# Patient Record
Sex: Female | Born: 1955 | Race: White | Hispanic: No | Marital: Single | Smoking: Former smoker
Health system: Southern US, Community
[De-identification: ages and names within clinical notes are randomized; demographics above are authoritative.]

## PROBLEM LIST (undated history)

## (undated) DIAGNOSIS — M1990 Unspecified osteoarthritis, unspecified site: Secondary | ICD-10-CM

## (undated) DIAGNOSIS — G8929 Other chronic pain: Secondary | ICD-10-CM

## (undated) DIAGNOSIS — J45909 Unspecified asthma, uncomplicated: Secondary | ICD-10-CM

## (undated) DIAGNOSIS — L0390 Cellulitis, unspecified: Secondary | ICD-10-CM

## (undated) DIAGNOSIS — G4733 Obstructive sleep apnea (adult) (pediatric): Secondary | ICD-10-CM

## (undated) DIAGNOSIS — R51 Headache: Secondary | ICD-10-CM

## (undated) DIAGNOSIS — E669 Obesity, unspecified: Secondary | ICD-10-CM

## (undated) DIAGNOSIS — N390 Urinary tract infection, site not specified: Secondary | ICD-10-CM

## (undated) DIAGNOSIS — K219 Gastro-esophageal reflux disease without esophagitis: Secondary | ICD-10-CM

## (undated) DIAGNOSIS — I10 Essential (primary) hypertension: Secondary | ICD-10-CM

## (undated) DIAGNOSIS — E46 Unspecified protein-calorie malnutrition: Secondary | ICD-10-CM

## (undated) DIAGNOSIS — H348192 Central retinal vein occlusion, unspecified eye, stable: Secondary | ICD-10-CM

## (undated) HISTORY — PX: EYE SURGERY: SHX253

## (undated) HISTORY — DX: Headache: R51

## (undated) HISTORY — DX: Urinary tract infection, site not specified: N39.0

## (undated) HISTORY — DX: Obesity, unspecified: E66.9

## (undated) HISTORY — DX: Unspecified osteoarthritis, unspecified site: M19.90

## (undated) HISTORY — DX: Obstructive sleep apnea (adult) (pediatric): G47.33

## (undated) HISTORY — DX: Unspecified protein-calorie malnutrition: E46

## (undated) HISTORY — PX: APPENDECTOMY: SHX54

## (undated) HISTORY — DX: Cellulitis, unspecified: L03.90

## (undated) HISTORY — DX: Central retinal vein occlusion, unspecified eye, stable: H34.8192

---

## 1978-07-09 HISTORY — PX: ORIF ANKLE FRACTURE: SUR919

## 2000-07-09 HISTORY — PX: GASTRIC BYPASS: SHX52

## 2001-07-09 HISTORY — PX: CHOLECYSTECTOMY: SHX55

## 2004-09-03 ENCOUNTER — Other Ambulatory Visit: Payer: Self-pay

## 2004-09-03 ENCOUNTER — Emergency Department: Payer: Self-pay | Admitting: Emergency Medicine

## 2004-10-18 ENCOUNTER — Ambulatory Visit: Payer: Self-pay | Admitting: Neurology

## 2008-02-12 ENCOUNTER — Other Ambulatory Visit: Payer: Self-pay

## 2008-02-12 ENCOUNTER — Inpatient Hospital Stay: Payer: Self-pay | Admitting: Internal Medicine

## 2008-03-02 ENCOUNTER — Ambulatory Visit: Payer: Self-pay | Admitting: Orthopedic Surgery

## 2008-03-26 ENCOUNTER — Emergency Department: Payer: Self-pay | Admitting: Emergency Medicine

## 2008-07-28 ENCOUNTER — Emergency Department: Payer: Self-pay | Admitting: Emergency Medicine

## 2008-10-01 ENCOUNTER — Emergency Department: Payer: Self-pay | Admitting: Emergency Medicine

## 2009-09-16 ENCOUNTER — Emergency Department: Payer: Self-pay | Admitting: Emergency Medicine

## 2011-06-07 ENCOUNTER — Ambulatory Visit: Payer: Self-pay | Admitting: Surgery

## 2011-06-07 DIAGNOSIS — I10 Essential (primary) hypertension: Secondary | ICD-10-CM

## 2011-06-09 HISTORY — PX: HERNIA REPAIR: SHX51

## 2011-06-18 ENCOUNTER — Observation Stay: Payer: Self-pay | Admitting: Surgery

## 2011-07-10 HISTORY — PX: REPLACEMENT TOTAL KNEE: SUR1224

## 2011-09-05 ENCOUNTER — Ambulatory Visit: Payer: Self-pay | Admitting: Orthopedic Surgery

## 2011-10-15 ENCOUNTER — Ambulatory Visit: Payer: Self-pay | Admitting: Orthopedic Surgery

## 2011-10-15 LAB — CBC
HCT: 41.9 % (ref 35.0–47.0)
MCHC: 33.7 g/dL (ref 32.0–36.0)
Platelet: 227 10*3/uL (ref 150–440)
RBC: 4.51 10*6/uL (ref 3.80–5.20)
RDW: 13.1 % (ref 11.5–14.5)
WBC: 5.8 10*3/uL (ref 3.6–11.0)

## 2011-10-15 LAB — APTT: Activated PTT: 27.8 secs (ref 23.6–35.9)

## 2011-10-15 LAB — BASIC METABOLIC PANEL
Anion Gap: 6 — ABNORMAL LOW (ref 7–16)
Calcium, Total: 8.6 mg/dL (ref 8.5–10.1)
EGFR (African American): >60
EGFR (Non-African Amer.): >60
Osmolality: 285 (ref 275–301)
Sodium: 142 mmol/L (ref 136–145)

## 2011-10-15 LAB — PROTIME-INR: Prothrombin Time: 12.8 secs (ref 11.5–14.7)

## 2011-10-15 LAB — SEDIMENTATION RATE: Erythrocyte Sed Rate: 9 mm/hr (ref 0–30)

## 2011-10-18 ENCOUNTER — Inpatient Hospital Stay: Payer: Self-pay | Admitting: Orthopedic Surgery

## 2011-10-19 LAB — BASIC METABOLIC PANEL
Calcium, Total: 8.1 mg/dL — ABNORMAL LOW (ref 8.5–10.1)
Chloride: 103 mmol/L (ref 98–107)
Co2: 24 mmol/L (ref 21–32)
EGFR (African American): >60
Glucose: 109 mg/dL — ABNORMAL HIGH (ref 65–99)
Osmolality: 276 (ref 275–301)
Potassium: 4.5 mmol/L (ref 3.5–5.1)
Sodium: 136 mmol/L (ref 136–145)

## 2011-10-19 LAB — PLATELET COUNT: Platelet: 228 10*3/uL (ref 150–440)

## 2011-10-22 ENCOUNTER — Encounter: Payer: Self-pay | Admitting: Internal Medicine

## 2011-10-22 LAB — HEMOGLOBIN: HGB: 9.8 g/dL — ABNORMAL LOW (ref 12.0–16.0)

## 2011-10-22 LAB — PATHOLOGY REPORT

## 2011-11-07 ENCOUNTER — Encounter: Payer: Self-pay | Admitting: Internal Medicine

## 2011-11-15 LAB — CBC WITH DIFFERENTIAL/PLATELET
Basophil #: 0 10*3/uL (ref 0.0–0.1)
Basophil %: 0.5 %
Eosinophil #: 0.3 10*3/uL (ref 0.0–0.7)
HCT: 37.4 % (ref 35.0–47.0)
Lymphocyte #: 1.3 10*3/uL (ref 1.0–3.6)
MCHC: 33.1 g/dL (ref 32.0–36.0)
MCV: 92 fL (ref 80–100)
Monocyte #: 0.6 x10 3/mm (ref 0.2–0.9)
Monocyte %: 12 %
Neutrophil #: 2.6 10*3/uL (ref 1.4–6.5)
Neutrophil %: 54.4 %
Platelet: 227 10*3/uL (ref 150–440)
RDW: 13.8 % (ref 11.5–14.5)

## 2011-11-15 LAB — BASIC METABOLIC PANEL
BUN: 6 mg/dL — ABNORMAL LOW (ref 7–18)
Chloride: 104 mmol/L (ref 98–107)
Co2: 28 mmol/L (ref 21–32)
EGFR (African American): >60
Potassium: 3.7 mmol/L (ref 3.5–5.1)
Sodium: 139 mmol/L (ref 136–145)

## 2011-12-28 ENCOUNTER — Ambulatory Visit: Payer: Self-pay | Admitting: Gastroenterology

## 2012-01-01 ENCOUNTER — Ambulatory Visit: Payer: Self-pay | Admitting: Gastroenterology

## 2012-01-07 HISTORY — PX: OTHER SURGICAL HISTORY: SHX169

## 2012-01-11 ENCOUNTER — Emergency Department: Payer: Self-pay | Admitting: Internal Medicine

## 2012-01-11 LAB — URINALYSIS, COMPLETE
Bilirubin,UR: NEGATIVE
Blood: NEGATIVE
Glucose,UR: NEGATIVE mg/dL (ref 0–75)
Hyaline Cast: 13
Leukocyte Esterase: NEGATIVE
RBC,UR: 1 /HPF (ref 0–5)
Specific Gravity: 1.013 (ref 1.003–1.030)
WBC UR: 4 /HPF (ref 0–5)

## 2012-01-11 LAB — COMPREHENSIVE METABOLIC PANEL
Bilirubin,Total: 0.7 mg/dL (ref 0.2–1.0)
Chloride: 103 mmol/L (ref 98–107)
Co2: 26 mmol/L (ref 21–32)
Creatinine: 0.74 mg/dL (ref 0.60–1.30)
EGFR (African American): >60
EGFR (Non-African Amer.): >60
Osmolality: 276 (ref 275–301)
Potassium: 3.6 mmol/L (ref 3.5–5.1)
SGOT(AST): 23 U/L (ref 15–37)
SGPT (ALT): 26 U/L

## 2012-01-11 LAB — CK TOTAL AND CKMB (NOT AT ARMC)
CK, Total: 53 U/L (ref 21–215)
CK-MB: 1.1 ng/mL (ref 0.5–3.6)

## 2012-01-11 LAB — CBC
HCT: 41 % (ref 35.0–47.0)
MCHC: 33.4 g/dL (ref 32.0–36.0)
MCV: 89 fL (ref 80–100)
RDW: 15.5 % — ABNORMAL HIGH (ref 11.5–14.5)

## 2012-01-15 ENCOUNTER — Emergency Department: Payer: Self-pay | Admitting: Internal Medicine

## 2012-01-15 LAB — COMPREHENSIVE METABOLIC PANEL
Albumin: 3 g/dL — ABNORMAL LOW (ref 3.4–5.0)
BUN: 9 mg/dL (ref 7–18)
Bilirubin,Total: 0.7 mg/dL (ref 0.2–1.0)
Chloride: 101 mmol/L (ref 98–107)
Creatinine: 0.86 mg/dL (ref 0.60–1.30)
EGFR (African American): >60
EGFR (Non-African Amer.): >60
Glucose: 121 mg/dL — ABNORMAL HIGH (ref 65–99)
SGOT(AST): 24 U/L (ref 15–37)
SGPT (ALT): 22 U/L
Total Protein: 6.2 g/dL — ABNORMAL LOW (ref 6.4–8.2)

## 2012-01-15 LAB — URINALYSIS, COMPLETE
Bilirubin,UR: NEGATIVE
Glucose,UR: 50 mg/dL (ref 0–75)
Ph: 6 (ref 4.5–8.0)
Protein: 100
RBC,UR: 4 /HPF (ref 0–5)
Squamous Epithelial: 6

## 2012-01-15 LAB — TROPONIN I: Troponin-I: <0.02 ng/mL

## 2012-01-18 ENCOUNTER — Inpatient Hospital Stay: Payer: Self-pay | Admitting: Internal Medicine

## 2012-01-18 LAB — COMPREHENSIVE METABOLIC PANEL
Albumin: 2.9 g/dL — ABNORMAL LOW (ref 3.4–5.0)
Anion Gap: 15 (ref 7–16)
BUN: 7 mg/dL (ref 7–18)
Bilirubin,Total: 0.8 mg/dL (ref 0.2–1.0)
Chloride: 99 mmol/L (ref 98–107)
Creatinine: 0.71 mg/dL (ref 0.60–1.30)
EGFR (African American): >60
Glucose: 86 mg/dL (ref 65–99)
Potassium: 3.3 mmol/L — ABNORMAL LOW (ref 3.5–5.1)
Sodium: 139 mmol/L (ref 136–145)
Total Protein: 6.2 g/dL — ABNORMAL LOW (ref 6.4–8.2)

## 2012-01-18 LAB — CBC
MCH: 30.1 pg (ref 26.0–34.0)
MCHC: 33.8 g/dL (ref 32.0–36.0)
MCV: 89 fL (ref 80–100)
Platelet: 206 10*3/uL (ref 150–440)
RBC: 4.4 10*6/uL (ref 3.80–5.20)
RDW: 15.3 % — ABNORMAL HIGH (ref 11.5–14.5)

## 2012-01-18 LAB — URINALYSIS, COMPLETE
Blood: NEGATIVE
Glucose,UR: NEGATIVE mg/dL (ref 0–75)
Leukocyte Esterase: NEGATIVE
Nitrite: NEGATIVE
Ph: 6 (ref 4.5–8.0)

## 2012-01-18 LAB — PHOSPHORUS: Phosphorus: 3 mg/dL (ref 2.5–4.9)

## 2012-01-18 LAB — CK TOTAL AND CKMB (NOT AT ARMC)
CK, Total: 34 U/L (ref 21–215)
CK-MB: 0.8 ng/mL (ref 0.5–3.6)

## 2012-01-20 LAB — CBC WITH DIFFERENTIAL/PLATELET
Lymphocyte #: 1 10*3/uL (ref 1.0–3.6)
MCH: 30 pg (ref 26.0–34.0)
MCHC: 33.9 g/dL (ref 32.0–36.0)
MCV: 88 fL (ref 80–100)
Neutrophil #: 2 10*3/uL (ref 1.4–6.5)
Neutrophil %: 56.5 %
Platelet: 192 10*3/uL (ref 150–440)
RBC: 4.11 10*6/uL (ref 3.80–5.20)
RDW: 15.5 % — ABNORMAL HIGH (ref 11.5–14.5)

## 2012-01-20 LAB — COMPREHENSIVE METABOLIC PANEL
BUN: 4 mg/dL — ABNORMAL LOW (ref 7–18)
Bilirubin,Total: 0.7 mg/dL (ref 0.2–1.0)
Chloride: 106 mmol/L (ref 98–107)
Co2: 29 mmol/L (ref 21–32)
Potassium: 3.4 mmol/L — ABNORMAL LOW (ref 3.5–5.1)
SGOT(AST): 27 U/L (ref 15–37)
SGPT (ALT): 25 U/L
Total Protein: 5.6 g/dL — ABNORMAL LOW (ref 6.4–8.2)

## 2012-01-20 LAB — SEDIMENTATION RATE: Erythrocyte Sed Rate: 13 mm/hr (ref 0–30)

## 2012-01-21 LAB — MAGNESIUM: Magnesium: 1.7 mg/dL — ABNORMAL LOW

## 2012-02-19 ENCOUNTER — Other Ambulatory Visit: Payer: Self-pay | Admitting: Specialist

## 2012-02-19 LAB — CBC WITH DIFFERENTIAL/PLATELET
Basophil #: 0 10*3/uL (ref 0.0–0.1)
Eosinophil #: 0 10*3/uL (ref 0.0–0.7)
Lymphocyte #: 1.1 10*3/uL (ref 1.0–3.6)
MCH: 31.9 pg (ref 26.0–34.0)
MCV: 95 fL (ref 80–100)
Monocyte #: 0.5 x10 3/mm (ref 0.2–0.9)
Neutrophil %: 63.9 %
Platelet: 268 10*3/uL (ref 150–440)
RDW: 17.3 % — ABNORMAL HIGH (ref 11.5–14.5)

## 2012-02-19 LAB — COMPREHENSIVE METABOLIC PANEL
Albumin: 3 g/dL — ABNORMAL LOW (ref 3.4–5.0)
Alkaline Phosphatase: 109 U/L (ref 50–136)
BUN: 14 mg/dL (ref 7–18)
Bilirubin,Total: 0.7 mg/dL (ref 0.2–1.0)
Creatinine: 0.79 mg/dL (ref 0.60–1.30)
Osmolality: 283 (ref 275–301)
Total Protein: 6.2 g/dL — ABNORMAL LOW (ref 6.4–8.2)

## 2012-06-03 ENCOUNTER — Ambulatory Visit: Payer: Medicare Other | Admitting: Internal Medicine

## 2012-08-12 ENCOUNTER — Ambulatory Visit: Payer: Medicare Other | Admitting: Internal Medicine

## 2012-08-12 ENCOUNTER — Telehealth: Payer: Self-pay | Admitting: Internal Medicine

## 2012-08-12 NOTE — Telephone Encounter (Signed)
I have not ever seen this pt.  I would recommend going ahead and scheduling her an appt in April and if there is a cancellation - then can move her up (especially since I have not ever seen her).  Thanks.

## 2012-08-12 NOTE — Telephone Encounter (Signed)
Pt was r/s from today and I spoke with her Care giver. Pt was wondering if she would have to wait until April to get back in ?? I do not believe you have ever seen this pt before.

## 2012-08-13 NOTE — Telephone Encounter (Signed)
Scheduled with Raquel

## 2012-08-19 ENCOUNTER — Telehealth: Payer: Self-pay | Admitting: Internal Medicine

## 2012-08-19 ENCOUNTER — Emergency Department (HOSPITAL_COMMUNITY): Payer: Medicare Other

## 2012-08-19 ENCOUNTER — Encounter (HOSPITAL_COMMUNITY): Payer: Self-pay | Admitting: *Deleted

## 2012-08-19 ENCOUNTER — Inpatient Hospital Stay (HOSPITAL_COMMUNITY)
Admission: EM | Admit: 2012-08-19 | Discharge: 2012-08-25 | DRG: 603 | Disposition: A | Discharge status: Home Health | Payer: Medicare Other | Attending: Internal Medicine | Admitting: Internal Medicine

## 2012-08-19 DIAGNOSIS — D509 Iron deficiency anemia, unspecified: Secondary | ICD-10-CM | POA: Diagnosis present

## 2012-08-19 DIAGNOSIS — E669 Obesity, unspecified: Secondary | ICD-10-CM

## 2012-08-19 DIAGNOSIS — Z886 Allergy status to analgesic agent status: Secondary | ICD-10-CM

## 2012-08-19 DIAGNOSIS — Z880 Allergy status to penicillin: Secondary | ICD-10-CM

## 2012-08-19 DIAGNOSIS — G8929 Other chronic pain: Secondary | ICD-10-CM

## 2012-08-19 DIAGNOSIS — I890 Lymphedema, not elsewhere classified: Secondary | ICD-10-CM | POA: Diagnosis present

## 2012-08-19 DIAGNOSIS — D649 Anemia, unspecified: Secondary | ICD-10-CM

## 2012-08-19 DIAGNOSIS — Z888 Allergy status to other drugs, medicaments and biological substances status: Secondary | ICD-10-CM

## 2012-08-19 DIAGNOSIS — G894 Chronic pain syndrome: Secondary | ICD-10-CM | POA: Diagnosis present

## 2012-08-19 DIAGNOSIS — Z79899 Other long term (current) drug therapy: Secondary | ICD-10-CM

## 2012-08-19 DIAGNOSIS — L03119 Cellulitis of unspecified part of limb: Secondary | ICD-10-CM | POA: Diagnosis present

## 2012-08-19 DIAGNOSIS — I10 Essential (primary) hypertension: Secondary | ICD-10-CM | POA: Diagnosis present

## 2012-08-19 DIAGNOSIS — T80219A Unspecified infection due to central venous catheter, initial encounter: Secondary | ICD-10-CM

## 2012-08-19 DIAGNOSIS — J45909 Unspecified asthma, uncomplicated: Secondary | ICD-10-CM | POA: Diagnosis present

## 2012-08-19 DIAGNOSIS — Z96659 Presence of unspecified artificial knee joint: Secondary | ICD-10-CM

## 2012-08-19 DIAGNOSIS — L0390 Cellulitis, unspecified: Secondary | ICD-10-CM

## 2012-08-19 DIAGNOSIS — K219 Gastro-esophageal reflux disease without esophagitis: Secondary | ICD-10-CM | POA: Diagnosis present

## 2012-08-19 DIAGNOSIS — Z6841 Body Mass Index (BMI) 40.0 and over, adult: Secondary | ICD-10-CM

## 2012-08-19 DIAGNOSIS — L02419 Cutaneous abscess of limb, unspecified: Principal | ICD-10-CM | POA: Diagnosis present

## 2012-08-19 DIAGNOSIS — Z9884 Bariatric surgery status: Secondary | ICD-10-CM

## 2012-08-19 DIAGNOSIS — Z87891 Personal history of nicotine dependence: Secondary | ICD-10-CM

## 2012-08-19 DIAGNOSIS — R509 Fever, unspecified: Secondary | ICD-10-CM

## 2012-08-19 DIAGNOSIS — E46 Unspecified protein-calorie malnutrition: Secondary | ICD-10-CM | POA: Diagnosis present

## 2012-08-19 HISTORY — DX: Gastro-esophageal reflux disease without esophagitis: K21.9

## 2012-08-19 HISTORY — DX: Other chronic pain: G89.29

## 2012-08-19 HISTORY — DX: Essential (primary) hypertension: I10

## 2012-08-19 HISTORY — DX: Unspecified asthma, uncomplicated: J45.909

## 2012-08-19 LAB — CBC WITH DIFFERENTIAL/PLATELET
Basophils Relative: 0 % (ref 0–1)
Eosinophils Absolute: 0 10*3/uL (ref 0.0–0.7)
Eosinophils Relative: 0 % (ref 0–5)
HCT: 33.4 % — ABNORMAL LOW (ref 36.0–46.0)
Hemoglobin: 10.7 g/dL — ABNORMAL LOW (ref 12.0–15.0)
Lymphs Abs: 1 10*3/uL (ref 0.7–4.0)
MCH: 28 pg (ref 26.0–34.0)
MCHC: 32 g/dL (ref 30.0–36.0)
MCV: 87.4 fL (ref 78.0–100.0)
Monocytes Absolute: 0.9 10*3/uL (ref 0.1–1.0)
Monocytes Relative: 9 % (ref 3–12)
Neutrophils Relative %: 82 % — ABNORMAL HIGH (ref 43–77)
RBC: 3.82 MIL/uL — ABNORMAL LOW (ref 3.87–5.11)

## 2012-08-19 LAB — URINALYSIS, ROUTINE W REFLEX MICROSCOPIC
Ketones, ur: NEGATIVE mg/dL
Nitrite: NEGATIVE
Protein, ur: NEGATIVE mg/dL
pH: 5.5 (ref 5.0–8.0)

## 2012-08-19 LAB — COMPREHENSIVE METABOLIC PANEL
Alkaline Phosphatase: 115 U/L (ref 39–117)
BUN: 22 mg/dL (ref 6–23)
Creatinine, Ser: 0.86 mg/dL (ref 0.50–1.10)
GFR calc Af Amer: 85 mL/min — ABNORMAL LOW (ref >90)
Glucose, Bld: 120 mg/dL — ABNORMAL HIGH (ref 70–99)
Potassium: 4.2 mEq/L (ref 3.5–5.1)
Total Protein: 7.4 g/dL (ref 6.0–8.3)

## 2012-08-19 LAB — URINE MICROSCOPIC-ADD ON

## 2012-08-19 MED ORDER — ACETAMINOPHEN 325 MG PO TABS
650.0000 mg | ORAL_TABLET | Freq: Once | ORAL | Status: AC
  Administered 2012-08-19: 650 mg via ORAL
  Filled 2012-08-19: qty 2

## 2012-08-19 MED ORDER — SENNOSIDES-DOCUSATE SODIUM 8.6-50 MG PO TABS: 1.0000 | ORAL_TABLET | Freq: Three times a day (TID) | ORAL | Status: DC | PRN

## 2012-08-19 MED ORDER — ADULT MULTIVITAMIN W/MINERALS CH
1.0000 | ORAL_TABLET | Freq: Every day | ORAL | Status: DC
  Administered 2012-08-21: 1 via ORAL
  Filled 2012-08-19 (×6): qty 1

## 2012-08-19 MED ORDER — GENTEAL OP GEL: 1.0000 [drp] | Freq: Every day | OPHTHALMIC | Status: DC | PRN

## 2012-08-19 MED ORDER — VANCOMYCIN HCL 10 G IV SOLR
1250.0000 mg | Freq: Two times a day (BID) | INTRAVENOUS | Status: DC
  Administered 2012-08-20 – 2012-08-23 (×8): 1250 mg via INTRAVENOUS
  Filled 2012-08-19 (×10): qty 1250

## 2012-08-19 MED ORDER — ALPRAZOLAM 0.5 MG PO TABS
1.2500 mg | ORAL_TABLET | Freq: Every day | ORAL | Status: DC
  Filled 2012-08-19: qty 2

## 2012-08-19 MED ORDER — FLUTICASONE PROPIONATE 50 MCG/ACT NA SUSP
1.0000 | Freq: Two times a day (BID) | NASAL | Status: DC
  Administered 2012-08-20 – 2012-08-21 (×4): 1 via NASAL
  Administered 2012-08-22 – 2012-08-24 (×5): 2 via NASAL
  Filled 2012-08-19: qty 16

## 2012-08-19 MED ORDER — VANCOMYCIN HCL 10 G IV SOLR
2000.0000 mg | Freq: Once | INTRAVENOUS | Status: AC
  Administered 2012-08-20: 2000 mg via INTRAVENOUS
  Filled 2012-08-19: qty 2000

## 2012-08-19 MED ORDER — ONDANSETRON HCL 4 MG PO TABS
4.0000 mg | ORAL_TABLET | Freq: Four times a day (QID) | ORAL | Status: DC | PRN
  Administered 2012-08-21 – 2012-08-25 (×4): 4 mg via ORAL
  Filled 2012-08-19 (×4): qty 1

## 2012-08-19 MED ORDER — IRBESARTAN 75 MG PO TABS
75.0000 mg | ORAL_TABLET | Freq: Every day | ORAL | Status: DC
  Filled 2012-08-19 (×6): qty 1

## 2012-08-19 MED ORDER — ONDANSETRON HCL 8 MG PO TABS: 4.0000 mg | ORAL_TABLET | ORAL | Status: DC | PRN

## 2012-08-19 MED ORDER — LEVOFLOXACIN IN D5W 750 MG/150ML IV SOLN
750.0000 mg | INTRAVENOUS | Status: DC
  Filled 2012-08-19: qty 150

## 2012-08-19 MED ORDER — HEPARIN SODIUM (PORCINE) 5000 UNIT/ML IJ SOLN
5000.0000 [IU] | Freq: Three times a day (TID) | INTRAMUSCULAR | Status: DC
  Administered 2012-08-20 – 2012-08-24 (×13): 5000 [IU] via SUBCUTANEOUS
  Filled 2012-08-19 (×19): qty 1

## 2012-08-19 MED ORDER — LEVALBUTEROL TARTRATE 45 MCG/ACT IN AERO
1.0000 | INHALATION_SPRAY | RESPIRATORY_TRACT | Status: DC | PRN
Start: 2012-08-19 — End: 2012-08-25
  Filled 2012-08-19: qty 15

## 2012-08-19 MED ORDER — POTASSIUM CHLORIDE CRYS ER 10 MEQ PO TBCR
10.0000 meq | EXTENDED_RELEASE_TABLET | Freq: Two times a day (BID) | ORAL | Status: DC
  Administered 2012-08-21 – 2012-08-24 (×4): 10 meq via ORAL
  Filled 2012-08-19 (×12): qty 1

## 2012-08-19 MED ORDER — ONDANSETRON HCL 4 MG/2ML IJ SOLN: 4.0000 mg | Freq: Four times a day (QID) | INTRAMUSCULAR | Status: DC | PRN

## 2012-08-19 MED ORDER — DOCUSATE SODIUM 100 MG PO CAPS
100.0000 mg | ORAL_CAPSULE | Freq: Every day | ORAL | Status: DC
  Filled 2012-08-19 (×7): qty 1

## 2012-08-19 MED ORDER — VITAMIN B-12 100 MCG PO TABS
100.0000 ug | ORAL_TABLET | Freq: Every day | ORAL | Status: DC
  Administered 2012-08-21 – 2012-08-22 (×2): 100 ug via ORAL
  Filled 2012-08-19 (×6): qty 1

## 2012-08-19 MED ORDER — MAGNESIUM HYDROXIDE 400 MG/5ML PO SUSP
30.0000 mL | ORAL | Status: DC
  Filled 2012-08-19 (×24): qty 30
  Filled 2012-08-19: qty 60
  Filled 2012-08-19 (×13): qty 30

## 2012-08-19 MED ORDER — OXYCODONE HCL 5 MG PO TABS
10.0000 mg | ORAL_TABLET | ORAL | Status: DC | PRN
  Administered 2012-08-20 – 2012-08-22 (×9): 10 mg via ORAL
  Administered 2012-08-22: 5 mg via ORAL
  Administered 2012-08-22 – 2012-08-25 (×15): 10 mg via ORAL
  Filled 2012-08-19 (×26): qty 2

## 2012-08-19 MED ORDER — FLUTICASONE PROPIONATE HFA 110 MCG/ACT IN AERO
1.0000 | INHALATION_SPRAY | Freq: Two times a day (BID) | RESPIRATORY_TRACT | Status: DC
  Administered 2012-08-20 – 2012-08-21 (×3): 2 via RESPIRATORY_TRACT
  Administered 2012-08-21: 1 via RESPIRATORY_TRACT
  Administered 2012-08-22: 2 via RESPIRATORY_TRACT
  Administered 2012-08-22 – 2012-08-23 (×3): 1 via RESPIRATORY_TRACT
  Administered 2012-08-24 (×2): 2 via RESPIRATORY_TRACT
  Filled 2012-08-19: qty 12

## 2012-08-19 MED ORDER — HYDROCORTISONE 2.5 % RE CREA
1.0000 "application " | TOPICAL_CREAM | Freq: Two times a day (BID) | RECTAL | Status: DC
  Administered 2012-08-20 – 2012-08-24 (×5): 1 via RECTAL
  Filled 2012-08-19: qty 28.35

## 2012-08-19 MED ORDER — PANTOPRAZOLE SODIUM 40 MG PO TBEC
80.0000 mg | DELAYED_RELEASE_TABLET | Freq: Every day | ORAL | Status: DC
  Administered 2012-08-20: 80 mg via ORAL
  Filled 2012-08-19 (×2): qty 2

## 2012-08-19 MED ORDER — ACETAMINOPHEN 500 MG PO TABS
500.0000 mg | ORAL_TABLET | ORAL | Status: DC | PRN
  Administered 2012-08-20 (×3): 500 mg via ORAL
  Filled 2012-08-19: qty 1
  Filled 2012-08-19: qty 2
  Filled 2012-08-19 (×2): qty 1

## 2012-08-19 MED ORDER — CLINDAMYCIN PHOSPHATE 600 MG/50ML IV SOLN
600.0000 mg | Freq: Once | INTRAVENOUS | Status: DC
  Filled 2012-08-19: qty 50

## 2012-08-19 NOTE — ED Notes (Addendum)
Clindamycin stopped because IV was painful to patient, will attempt another IV access site upon pt return from radiology.

## 2012-08-19 NOTE — ED Notes (Signed)
Right knee is warm, red and tender to touch.

## 2012-08-19 NOTE — ED Provider Notes (Signed)
History     CSN: [id number]  Arrival date & time 08/19/12  1532   First MD Initiated Contact with Patient 08/19/12 1955      Chief Complaint  Patient presents with  . Fever  . Nausea    (Consider location/radiation/quality/duration/timing/severity/associated sxs/prior treatment) HPI Comments: Mr. 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  Unknown period of time ago.  It as a broken port since December.  That has been jerryrigged with tape, and needle she still is using the other port for heart TPN supplementation.  Presents to this emergency room with one week of fever, chills, erythema, and pain to her right knee, and lower leg.  The right knee, had a joint replacement, done at [facility] in April of last year.  She has had no known infection in that joint to date.  She also reports that over the last 2 or 3, days.  She's had increased pain in the right lower leg.  She had pain in her right knee.  Last week.  That lasted 3 days, but spontaneously resolved.  She, states she called a physician at [facility].  A week ago, who put her on 5 days of azithromycin without being seen.  This has not improved.  Her fever or chills.  She states she had an appointment with all of our physician.  Last week.  That was canceled due to the physicians.  Illness, and has been rescheduled for the end of February.  She has no local physician.  No one is managing her PICC line at this time, although she continues to get TPN supplementation.  Through a home intravenous therapy service  Patient is a 56 y.o. female presenting with fever. The history is provided by the patient.  Fever Temp source:  Subjective Severity:  Severe Onset quality:  Sudden Timing:  Intermittent Progression:  Worsening Chronicity:  New Relieved by:   Acetaminophen Associated symptoms: chills and cough   Associated symptoms: no chest pain, no myalgias, no nausea and no vomiting   Risk factors: immunosuppression     Past Medical History  Diagnosis Date  . Asthma   . Hypertension   . Chronic pain     Past Surgical History  Procedure Laterality Date  . Gastric bypass    . Replacement total knee Right 2013    No family history on file.  History  Substance Use Topics  . Smoking status: Former Games developer  . Smokeless tobacco: Not on file  . Alcohol Use: No    OB History   Grav Para Term Preterm Abortions TAB SAB Ect Mult Living                  Review of Systems  Constitutional: Positive for fever and chills.  HENT: Negative.   Respiratory: Positive for cough. Negative for shortness of breath and wheezing.   Cardiovascular: Positive for leg swelling. Negative for chest pain.  Gastrointestinal: Negative for nausea and vomiting.  Musculoskeletal: Positive for joint swelling. Negative for myalgias.  All other systems reviewed and are negative.    Allergies  Penicillins; Antihistamines, diphenhydramine-type; Aspirin; and Latex  Home Medications   Current Outpatient Rx  Name  Route  Sig  Dispense  Refill  . acetaminophen (TYLENOL) 500 MG tablet   Oral   Take 500-1,000 mg by mouth every 3 (  three) hours as needed for pain.         Marland Kitchen ALPRAZolam (XANAX) 0.5 MG tablet   Oral   Take 1.25-2 mg by mouth at bedtime.          . Artificial Tear (GENTEAL) GEL   Both Eyes   Place 1 drop into both eyes 5 (five) times daily as needed. For dry eyes         . Cyanocobalamin (VITAMIN B-12 PO)   Oral   Take 1 tablet by mouth daily.         Marland Kitchen docusate sodium (COLACE) 100 MG capsule   Oral   Take 100 mg by mouth daily with lunch.         Haywood Pao HFA 110 MCG/ACT inhaler   Inhalation   Inhale 1-2 puffs into the lungs 2 (two) times daily.          . fluticasone (FLONASE) 50 MCG/ACT nasal spray   Nasal   Place  1-2 sprays into the nose 2 (two) times daily.          . hydrocortisone (PROCTOSOL HC) 2.5 % rectal cream   Rectal   Place 1 application rectally 2 (two) times daily.         Marland Kitchen levalbuterol (XOPENEX HFA) 45 MCG/ACT inhaler   Inhalation   Inhale 1-2 puffs into the lungs every 4 (four) hours as needed for wheezing or shortness of breath.         . magnesium hydroxide (MILK OF MAGNESIA) 400 MG/5ML suspension   Oral   Take 30 mLs by mouth every 2 (two) hours.         Marland Kitchen MICARDIS 40 MG tablet   Oral   Take 20 mg by mouth daily as needed. If blood pressure is over 138         . Multiple Vitamin (MULTIVITAMIN WITH MINERALS) TABS   Oral   Take 1 tablet by mouth daily.         Marland Kitchen NEXIUM 40 MG capsule   Oral   Take 40 mg by mouth 2 (two) times daily.          . ondansetron (ZOFRAN) 4 MG tablet   Oral   Take 4 mg by mouth every 4 (four) hours as needed for nausea.         Marland Kitchen OVER THE COUNTER MEDICATION   Oral   Take 400 mg by mouth daily. folate         . OVER THE COUNTER MEDICATION   Topical   Apply 1 application topically 2 (two) times daily. Fanny cream         . Oxycodone HCl 10 MG TABS   Oral   Take 10 mg by mouth every 4 (four) hours as needed. For pain         . potassium chloride (MICRO-K) 10 MEQ CR capsule   Oral   Take 10 mEq by mouth daily.          . sennosides-docusate sodium (SENOKOT-S) 8.6-50 MG tablet   Oral   Take 1 tablet by mouth 3 (three) times daily as needed for constipation.           BP 127/62  Pulse 101  Temp(Src) 98.5 F (36.9 C) (Oral)  Resp 18  SpO2 97%  Physical Exam  Constitutional: She is oriented to person, place, and time. She appears well-developed and well-nourished.  Morbidly obese  HENT:  Head: Normocephalic and atraumatic.  Eyes: Pupils are equal, round, and reactive to light.  Neck: Normal range of motion.  Cardiovascular: Regular rhythm.  Tachycardia present.   Pulmonary/Chest: Effort normal and  breath sounds normal. No respiratory distress. She has no wheezes.  Abdominal: Soft. She exhibits no distension. There is no tenderness.  Abdominal assessment difficult due to  body habitus  Musculoskeletal: She exhibits edema and tenderness.       Right knee: She exhibits erythema. She exhibits no swelling. Tenderness found.       Legs: Neurological: She is alert and oriented to person, place, and time.  Skin: There is erythema.     Saline appearance to right lower leg from midline of knee, including the lateral aspect with edema, and another area of cellulitis, just above the ankle.  It is a band circumferentially, without perceived edema    ED Course  Procedures (including critical care time)  Labs Reviewed  CBC WITH DIFFERENTIAL - Abnormal; Notable for the following:    WBC 10.6 (*)    RBC 3.82 (*)    Hemoglobin 10.7 (*)    HCT 33.4 (*)    Neutrophils Relative 82 (*)    Neutro Abs 8.6 (*)    Lymphocytes Relative 9 (*)    All other components within normal limits  COMPREHENSIVE METABOLIC PANEL - Abnormal; Notable for the following:    Sodium 134 (*)    Glucose, Bld 120 (*)    Albumin 2.7 (*)    GFR calc non Af Amer 74 (*)    GFR calc Af Amer 85 (*)    All other components within normal limits  URINALYSIS, ROUTINE W REFLEX MICROSCOPIC - Abnormal; Notable for the following:    Color, Urine AMBER (*)    APPearance HAZY (*)    Bilirubin Urine SMALL (*)    Leukocytes, UA TRACE (*)    All other components within normal limits  URINE MICROSCOPIC-ADD ON - Abnormal; Notable for the following:    Squamous Epithelial / LPF FEW (*)    Crystals CA OXALATE CRYSTALS (*)    All other components within normal limits  CULTURE, BLOOD (ROUTINE X 2)  CULTURE, BLOOD (ROUTINE X 2)  URINE CULTURE   Dg Chest 2 View  08/19/2012  *RADIOLOGY REPORT*  Clinical Data: Fever.  CHEST - 2 VIEW  Comparison: None.  Findings: The lungs are well-aerated.  Mild opacity at the right lung base may reflect  atelectasis or possibly mild pneumonia. There is no evidence of focal opacification, pleural effusion or pneumothorax.  The heart is mildly enlarged; the mediastinal contour is within normal limits.  No acute osseous abnormalities are seen.  The patient's right PICC is noted extending into the right atrium.  IMPRESSION:  1.  Mild opacity at the right lung base may reflect atelectasis or possibly mild pneumonia. 2.  Mild cardiomegaly.   Original Report Authenticated By: Tonia Ghent, M.D.      1. Cellulitis   2. PICC line infection       MDM   Due to patient's history of one week, shaking, chills, fever, erythema, and cellulitis of the right leg.  A PICC line, with a broken port.  She will be admitted.  I have ordered blood cultures, urine culture, and it.  Chest x-ray, although her labs do not show overwhelming infection, and concerned, that she may have an infection at the tip of her PICC line TPN has been held at this time.  I've spoken with pharmacy.  He  will initiate TPN tomorrow as needed.  After a new PICC line has been inserted.  They will confer with the home infusion company as to formulation.  She has been started on 600 mg of clindamycin after blood cultures performed.        Arman Filter, NP 08/19/12 2225

## 2012-08-19 NOTE — Telephone Encounter (Signed)
Left message on answering machine to call back.

## 2012-08-19 NOTE — Progress Notes (Signed)
ANTIBIOTIC CONSULT NOTE - INITIAL  Pharmacy Consult for Vancomycin Indication: cellulitis  Allergies  Allergen Reactions  . Penicillins Anaphylaxis  . Antihistamines, Diphenhydramine-Type Other (See Comments)  . Aspirin Other (See Comments)    Severe abdominal pain and sweating  . Latex Hives    Patient Measurements: Height: 5\' 4"  (162.6 cm) Weight: 310 lb (140.615 kg) (per patient) IBW/kg (Calculated) : 54.7 Adjusted Body Weight: 90 kg   Vital Signs: Temp: 98.5 F (36.9 C) (02/11 2024) Temp src: Oral (02/11 2024) BP: 149/82 mmHg (02/11 2334) Pulse Rate: 125 (02/11 2334)  Labs:  Recent Labs  08/19/12 1633  WBC 10.6*  HGB 10.7*  PLT 302  CREATININE 0.86   Estimated Creatinine Clearance: 101.5 ml/min (by C-G formula based on Cr of 0.86). No results found for this basename: VANCOTROUGH, VANCOPEAK, VANCORANDOM, GENTTROUGH, GENTPEAK, GENTRANDOM, TOBRATROUGH, TOBRAPEAK, TOBRARND, AMIKACINPEAK, AMIKACINTROU, AMIKACIN,  in the last 72 hours   Microbiology: No results found for this or any previous visit (from the past 720 hour(s)).  Medical History: Past Medical History  Diagnosis Date  . Asthma   . Hypertension   . Chronic pain     Medications:  APAP  Xanax  Vitamin B12  Colace  Flonase  Xopenex  Micardis  MVI  Nexium  OxyIR  KCl  Senokot-S  Assessment: 57 yo female with cellulitis for empiric antibiotics    Goal of Therapy:  Vancomycin trough 10-15  Plan:  Vancomycin 2 g IV now, then 1250 mg IV q12h  Eddie Candle 08/19/2012,11:37 PM

## 2012-08-19 NOTE — ED Notes (Signed)
Pt updated on care, VSS reassessed

## 2012-08-19 NOTE — ED Notes (Addendum)
Fever and nausea for several weeks, R knee pain/warmth redness x 1 week (knee replacement in April) and 1 broken port to R picc line.  Fever of 99.1 in Ed.  No s/s of infection to picc line site.  Pt c/o "stinky urine". Pt has TPN every night, but the bag for today is contaminated and she would like a new one.

## 2012-08-19 NOTE — ED Notes (Signed)
Hospitalist at bedside 

## 2012-08-19 NOTE — Telephone Encounter (Signed)
Yes agree, pt needs to go now to be evaluated with these concerns.

## 2012-08-19 NOTE — Telephone Encounter (Signed)
Caller: Debbie/Care Giver; Phone: [phone]; Reason for Call: Caregiver patient calling in states that patient has been having chills and difficulty focusing.  Patient is a new patient and has not been seen with pending appointment at the end of February.  Instructed caregiven that patient needs to be taken somewhere for evaluation of symptoms whether it be Urgent Care or Emergency Room.

## 2012-08-19 NOTE — H&P (Signed)
[facility] History and Physical  Mercades Bajaj MVH:[id number] DOB: 1955/12/20 DOA: 08/19/2012  Referring physician: ER physician.   Chief Complaint: Fever and chills.  HPI: Jacqueline James is a 57 y.o. female describes fever and chills for the last 2 weeks. There is no respiratory symptoms. In the last 3-4 days she has had redness in the right lower leg. She has a PICC line in place in the right arm and a piece of the exterior line has become broken. This lady has been on intravenous albumin 4 over a year. She had gastric bypass surgery several years ago and apparently has had complications from it. She also has had adhesions in her abdomen which is required surgery. She has had poor by mouth intake in the last couple weeks. When she was seen in the emergency room, she had a fever of 101.1.   Review of Systems:  Apart from history of present illness, the system negative.  Past Medical History  Diagnosis Date  . Asthma   . Hypertension   . Chronic pain    Past Surgical History  Procedure Laterality Date  . Gastric bypass    . Replacement total knee Right 2013   Social History:  She has been married. She lives alone. She has caregivers throughout the day. She has had these for the last couple of years. She does not smoke. She does not drink alcohol.  Allergies  Allergen Reactions  . Penicillins Anaphylaxis  . Antihistamines, Diphenhydramine-Type Other (See Comments)  . Aspirin Other (See Comments)    Severe abdominal pain and sweating  . Latex Hives    No family history on file. noncontributory.  Prior to Admission medications   Medication Sig Start Date End Date Taking? Authorizing Provider  acetaminophen (TYLENOL) 500 MG tablet Take 500-1,000 mg by mouth every 3 (three) hours as needed for pain.   Yes Historical Provider, MD  ALPRAZolam Prudy Feeler) 0.5 MG tablet Take 1.25-2 mg by mouth at bedtime.  06/27/12  Yes Historical Provider, MD  Artificial Tear (GENTEAL) GEL Place  1 drop into both eyes 5 (five) times daily as needed. For dry eyes   Yes Historical Provider, MD  Cyanocobalamin (VITAMIN B-12 PO) Take 1 tablet by mouth daily.   Yes Historical Provider, MD  docusate sodium (COLACE) 100 MG capsule Take 100 mg by mouth daily with lunch.   Yes Historical Provider, MD  FLOVENT HFA 110 MCG/ACT inhaler Inhale 1-2 puffs into the lungs 2 (two) times daily.  05/20/12  Yes Historical Provider, MD  fluticasone (FLONASE) 50 MCG/ACT nasal spray Place 1-2 sprays into the nose 2 (two) times daily.  06/24/12  Yes Historical Provider, MD  hydrocortisone (PROCTOSOL HC) 2.5 % rectal cream Place 1 application rectally 2 (two) times daily.   Yes Historical Provider, MD  levalbuterol [facility] HFA) 45 MCG/ACT inhaler Inhale 1-2 puffs into the lungs every 4 (four) hours as needed for wheezing or shortness of breath.   Yes Historical Provider, MD  magnesium hydroxide (MILK OF MAGNESIA) 400 MG/5ML suspension Take 30 mLs by mouth every 2 (two) hours.   Yes Historical Provider, MD  MICARDIS 40 MG tablet Take 20 mg by mouth daily as needed. If blood pressure is over 138 07/07/12  Yes Historical Provider, MD  Multiple Vitamin (MULTIVITAMIN WITH MINERALS) TABS Take 1 tablet by mouth daily.   Yes Historical Provider, MD  NEXIUM 40 MG capsule Take 40 mg by mouth 2 (two) times daily.  05/20/12  Yes Historical Provider, MD  ondansetron (ZOFRAN) 4 MG tablet Take 4 mg by mouth every 4 (four) hours as needed for nausea.   Yes Historical Provider, MD  OVER THE COUNTER MEDICATION Take 400 mg by mouth daily. folate   Yes Historical Provider, MD  OVER THE COUNTER MEDICATION Apply 1 application topically 2 (two) times daily. Fanny cream   Yes Historical Provider, MD  Oxycodone HCl 10 MG TABS Take 10 mg by mouth every 4 (four) hours as needed. For pain 08/05/12  Yes Historical Provider, MD  potassium chloride (MICRO-K) 10 MEQ CR capsule Take 10 mEq by mouth daily.  06/07/12  Yes Historical Provider, MD   sennosides-docusate sodium (SENOKOT-S) 8.6-50 MG tablet Take 1 tablet by mouth 3 (three) times daily as needed for constipation.   Yes Historical Provider, MD   Physical Exam: Filed Vitals:   08/19/12 2024 08/19/12 2030 08/19/12 2045 08/19/12 2307  BP:  117/48 127/62 183/86  Pulse:  103 101   Temp: 98.5 F (36.9 C)     TempSrc: Oral     Resp:  19 18   SpO2:  96% 97%      General:  She looks chronically sick. She looks older than her stated age of 57 years old. She is not acutely toxic/septic.  Eyes: No pallor. No jaundice.  ENT: Thick neck. No airway obstruction.  Neck: No lymphadenopathy.  Cardiovascular: Heart sounds present without murmurs. No gallop rhythm.  Respiratory: Lung fields are clear.  Abdomen: Obese abdomen. No tenderness. Difficult to assess for masses.  Skin: Cellulitis affecting the right lower leg from the knee downwards to the foot.  Musculoskeletal: No major joint problems that are acute.  Psychiatric: Anxious.  Neurologic: Alert and orientated without any focal neurological signs.  Labs on Admission:  Basic Metabolic Panel:  Recent Labs Lab 08/19/12 1633  NA 134*  K 4.2  CL 99  CO2 24  GLUCOSE 120*  BUN 22  CREATININE 0.86  CALCIUM 8.9   Liver Function Tests:  Recent Labs Lab 08/19/12 1633  AST 19  ALT 17  ALKPHOS 115  BILITOT 0.4  PROT 7.4  ALBUMIN 2.7*     CBC:  Recent Labs Lab 08/19/12 1633  WBC 10.6*  NEUTROABS 8.6*  HGB 10.7*  HCT 33.4*  MCV 87.4  PLT 302      Radiological Exams on Admission: Dg Chest 2 View  08/19/2012  *RADIOLOGY REPORT*  Clinical Data: Fever.  CHEST - 2 VIEW  Comparison: None.  Findings: The lungs are well-aerated.  Mild opacity at the right lung base may reflect atelectasis or possibly mild pneumonia. There is no evidence of focal opacification, pleural effusion or pneumothorax.  The heart is mildly enlarged; the mediastinal contour is within normal limits.  No acute osseous  abnormalities are seen.  The patient's right PICC is noted extending into the right atrium.  IMPRESSION:  1.  Mild opacity at the right lung base may reflect atelectasis or possibly mild pneumonia. 2.  Mild cardiomegaly.   Original Report Authenticated By: Tonia Ghent, M.D.       Assessment/Plan   1. Fever and chills, likely related to right lower leg cellulitis. 2. Right lower leg cellulitis. 3. Morbid obesity in a patient with previous history of gastric bypass surgery. 4. Chronic indwelling PICC line with intravenous albumin. 5. Chronic pain syndrome. 6. Hypertension.  Plan: 1. Admit to medical floor. 2. Intravenous antibiotics with vancomycin and Levaquin. 3. Will need consultation with infectious diseases and surgery tomorrow. 4. Remove PICC  line. 5. Bariatric diet.  Further recommendations will depend on patient's hospital progress the  Code Status: Full code.  Family Communication: Discussed plan with patient at the bedside.   Disposition Plan: Home in medically stable.   Time spent: 45 minutes.  Wilson Singer [facility] Pager [phone].  If 7PM-7AM, please contact night-coverage [email or web address] Password [facility] 08/19/2012, 11:19 PM

## 2012-08-19 NOTE — ED Notes (Signed)
Pt transported to radiology.

## 2012-08-19 NOTE — ED Notes (Signed)
PA at bedside.

## 2012-08-20 ENCOUNTER — Encounter (HOSPITAL_COMMUNITY): Payer: Self-pay | Admitting: Orthopedic Surgery

## 2012-08-20 DIAGNOSIS — M7989 Other specified soft tissue disorders: Secondary | ICD-10-CM

## 2012-08-20 DIAGNOSIS — R509 Fever, unspecified: Secondary | ICD-10-CM

## 2012-08-20 DIAGNOSIS — D649 Anemia, unspecified: Secondary | ICD-10-CM | POA: Diagnosis present

## 2012-08-20 DIAGNOSIS — M79609 Pain in unspecified limb: Secondary | ICD-10-CM

## 2012-08-20 DIAGNOSIS — L0291 Cutaneous abscess, unspecified: Secondary | ICD-10-CM

## 2012-08-20 LAB — COMPREHENSIVE METABOLIC PANEL
AST: 16 U/L (ref 0–37)
Albumin: 2.2 g/dL — ABNORMAL LOW (ref 3.5–5.2)
Alkaline Phosphatase: 92 U/L (ref 39–117)
Chloride: 105 mEq/L (ref 96–112)
Creatinine, Ser: 0.89 mg/dL (ref 0.50–1.10)
Potassium: 4 mEq/L (ref 3.5–5.1)
Total Bilirubin: 0.4 mg/dL (ref 0.3–1.2)
Total Protein: 6 g/dL (ref 6.0–8.3)

## 2012-08-20 LAB — CBC
HCT: 27.1 % — ABNORMAL LOW (ref 36.0–46.0)
MCH: 28.9 pg (ref 26.0–34.0)
MCV: 87.1 fL (ref 78.0–100.0)
Platelets: 268 10*3/uL (ref 150–400)
RBC: 3.11 MIL/uL — ABNORMAL LOW (ref 3.87–5.11)

## 2012-08-20 MED ORDER — POLYVINYL ALCOHOL 1.4 % OP SOLN
1.0000 [drp] | Freq: Every day | OPHTHALMIC | Status: DC | PRN
  Filled 2012-08-20: qty 15

## 2012-08-20 MED ORDER — ALPRAZOLAM 0.25 MG PO TABS
0.2500 mg | ORAL_TABLET | Freq: Every day | ORAL | Status: DC
Start: 2012-08-20 — End: 2012-08-25
  Administered 2012-08-20 – 2012-08-24 (×5): 0.25 mg via ORAL
  Filled 2012-08-20 (×5): qty 1

## 2012-08-20 MED ORDER — CLINDAMYCIN PHOSPHATE 600 MG/50ML IV SOLN
600.0000 mg | Freq: Three times a day (TID) | INTRAVENOUS | Status: DC
  Administered 2012-08-20: 600 mg via INTRAVENOUS
  Filled 2012-08-20 (×3): qty 50

## 2012-08-20 NOTE — ED Provider Notes (Signed)
Medical screening examination/treatment/procedure(s) were conducted as a shared visit with non-physician practitioner(s) and myself.  I personally evaluated the patient during the encounter  Jacqueline James is a 57 y.o. female hx of obesity with gastric bypass, on TPN for malnutrition here with fever. Fever for several days. Her PICC line has broken off since December. She also noted R leg cellulitis since yesterday. She has hx of R knee replacement but the ROM of the knee is normal. She is febrile and tachycardic in the ED. No hypotensive. Cultures sent, started on clindamycin. I suspect cellulitis and possible PICC line infection.    Richardean Canal, MD 08/20/12 2144

## 2012-08-20 NOTE — Progress Notes (Addendum)
[facility] PROGRESS NOTE  Jacqueline James MWU:[id number] DOB: April 02, 1956 DOA: 08/19/2012 PCP: No primary provider on file.  Assessment/Plan: 1. Fever/chills- ? Etiology- PICC Line infection vs viral infection vs cellutlitis 2. RLE cellulitis: Vanc/levaquin (patient refused- will change to clindamycin and consult ID), get duplex to r/o DVT, BC pending x 2, urine culture 3. HTN 4. Chronic pain syndrome- follows with pain clinic 5. S/p gastric bypass- not tolerating PO- getting TPN- ? reason- requested old records 6. Had indwelling PICC line- broken- removed 7. Leukocytosis- reolved  Code Status: full Family Communication: friend at bedside Disposition Plan: ? Await blood cultures   Consultants:  none  Procedures:  none  Antibiotics:    HPI/Subjective: Says she has poor PO intake and has been om TPN for protein issues -currently eating a biscuit from [employer]  -patient had gastric bypass at [facility] in 02 -she seeks her medical care at [facility], [place], [place], etc. ? Drug seeking behavior vs malingering vs psych d/o   Objective: Filed Vitals:   08/20/12 0101 08/20/12 0540 08/20/12 0758 08/20/12 1003  BP: 131/61 114/54  118/60  Pulse: 84 84    Temp: 99.8 F (37.7 C) 97.7 F (36.5 C)    TempSrc: Oral Oral    Resp: 21 20    Height:      Weight:      SpO2: 97% 98% 96%    No intake or output data in the 24 hours ending 08/20/12 1153 Filed Weights   08/19/12 2334  Weight: 140.615 kg (310 lb)    Exam:   General:  tearful  Cardiovascular: rrr  Respiratory: clear anterior  Abdomen: +BS, obese  Skin: mild redness in left LE, plus edmea  Data Reviewed: Basic Metabolic Panel:  Recent Labs Lab 08/19/12 1633 08/20/12 0650  NA 134* 138  K 4.2 4.0  CL 99 105  CO2 24 26  GLUCOSE 120* 98  BUN 22 20  CREATININE 0.86 0.89  CALCIUM 8.9 8.7   Liver Function Tests:  Recent Labs Lab 08/19/12 1633 08/20/12 0650  AST 19 16  ALT 17 12   ALKPHOS 115 92  BILITOT 0.4 0.4  PROT 7.4 6.0  ALBUMIN 2.7* 2.2*   No results found for this basename: LIPASE, AMYLASE,  in the last 168 hours No results found for this basename: AMMONIA,  in the last 168 hours CBC:  Recent Labs Lab 08/19/12 1633 08/20/12 0650  WBC 10.6* 7.4  NEUTROABS 8.6*  --   HGB 10.7* 9.0*  HCT 33.4* 27.1*  MCV 87.4 87.1  PLT 302 268   Cardiac Enzymes: No results found for this basename: CKTOTAL, CKMB, CKMBINDEX, TROPONINI,  in the last 168 hours BNP (last 3 results) No results found for this basename: PROBNP,  in the last 8760 hours CBG: No results found for this basename: GLUCAP,  in the last 168 hours  No results found for this or any previous visit (from the past 240 hour(s)).   Studies: Dg Chest 2 View  08/19/2012  *RADIOLOGY REPORT*  Clinical Data: Fever.  CHEST - 2 VIEW  Comparison: None.  Findings: The lungs are well-aerated.  Mild opacity at the right lung base may reflect atelectasis or possibly mild pneumonia. There is no evidence of focal opacification, pleural effusion or pneumothorax.  The heart is mildly enlarged; the mediastinal contour is within normal limits.  No acute osseous abnormalities are seen.  The patient's right PICC is noted extending into the right atrium.  IMPRESSION:  1.  Mild opacity at the right lung base may reflect atelectasis or possibly mild pneumonia. 2.  Mild cardiomegaly.   Original Report Authenticated By: Tonia Ghent, M.D.     Scheduled Meds: . ALPRAZolam  1.25-2 mg Oral QHS  . docusate sodium  100 mg Oral Q lunch  . fluticasone  1-2 spray Each Nare BID  . fluticasone  1-2 puff Inhalation BID  . heparin  5,000 Units Subcutaneous Q8H  . hydrocortisone  1 application Rectal BID  . irbesartan  75 mg Oral Daily  . levofloxacin (LEVAQUIN) IV  750 mg Intravenous Q24H  . magnesium hydroxide  30 mL Oral Q2H  . multivitamin with minerals  1 tablet Oral Daily  . pantoprazole  80 mg Oral Q1200  . potassium chloride   10 mEq Oral BID  . vancomycin  1,250 mg Intravenous Q12H  . vitamin B-12  100 mcg Oral Daily   Continuous Infusions:   Principal Problem:   Fever and chills Active Problems:   Cellulitis and abscess of lower leg   Obesity   Chronic pain   HTN (hypertension)    Time spent: 45    [facility], Swathi Dauphin  [facility] Pager [phone]. If 8PM-8AM, please contact night-coverage at [email or web address], password [facility] 08/20/2012, 11:53 AM  LOS: 1 day

## 2012-08-20 NOTE — Progress Notes (Signed)
*  PRELIMINARY RESULTS* Vascular Ultrasound Right lower extremity venous duplex has been completed.  Preliminary findings: Right:  No evidence of DVT, superficial thrombosis, or Baker's cyst.  Farrel Demark, RDMS, RVT  08/20/2012, 1:55 PM

## 2012-08-20 NOTE — Progress Notes (Signed)
Pt has refused all meds stating "they will just upset my stomach".  Pt also refused to have blood drawn out of left arm d/t restricted armband on right arm (PICC line in RUA).  Pt stated to lab tech "you will cut off my fluids with the tourniquet, you have to draw it out of right arm! The only reason it has a restriction is because of the cellulitis in my leg".

## 2012-08-20 NOTE — Progress Notes (Signed)
Pharmacy:  Levaquin not begun last night, charted as refused.  Rescheduled to begin today, then patient reported to RN that Levaquin had previously caused elbow tendons to pop.  Several years ago, long recovery time. Discussed with Dr. Benjamine Mola.  Levaquin discontinued, to begin Clindamycin.  IV Clindamycin stopped last night in ED, due to IV painful. Patient reported to me that flushing with saline caused burning; willing to try IV Clindamycin today. Levaquin added to allergies.  She also reported recent Cipro course without problems, but prior intolerance to Avelox. Penicillins case her lips and throat to swell.  Nicolette Bang, RPh Pager: [phone] 08/20/2012 12:30pm

## 2012-08-20 NOTE — Progress Notes (Signed)
UR COMPLETED  

## 2012-08-20 NOTE — Consult Note (Signed)
[facility]    Date of Admission:  08/19/2012           Day 1 vancomycin        Day 1 clindamycin       Reason for Consult: Fever and chills    Referring Physician: Dr. Marlin Canary  Principal Problem:   Fever and chills Active Problems:   Cellulitis and abscess of lower leg   Obesity   Chronic pain   HTN (hypertension)   Anemia   . ALPRAZolam  1.25-2 mg Oral QHS  . clindamycin (CLEOCIN) IV  600 mg Intravenous Q8H  . docusate sodium  100 mg Oral Q lunch  . fluticasone  1-2 spray Each Nare BID  . fluticasone  1-2 puff Inhalation BID  . heparin  5,000 Units Subcutaneous Q8H  . hydrocortisone  1 application Rectal BID  . irbesartan  75 mg Oral Daily  . magnesium hydroxide  30 mL Oral Q2H  . multivitamin with minerals  1 tablet Oral Daily  . pantoprazole  80 mg Oral Q1200  . potassium chloride  10 mEq Oral BID  . vancomycin  1,250 mg Intravenous Q12H  . vitamin B-12  100 mcg Oral Daily    Recommendations: 1. Continue vancomycin 2. Discontinue clindamycin   Assessment: I suspect that the most likely cause of her recent fever or his right leg cellulitis. 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 She seems to be improving on empiric antibiotics. I favor treatment with vancomycin alone pending culture results and further observation.    HPI: Jacqueline James is a 57 y.o. female who was admitted yesterday he with a one-week history of subjective fevers, chills and right lower leg pain. She has a very complicated past medical history including bariatric surgery in [place] in 2002. The last several years she has been on outpatient TNA through a PICC for chronically low albumin. It appears that she is in the process of changing her care to physicians hearing [facility] and she currently does not have a actively managing physician. She  had a double-lumen PICC in place and one of the ports was broken in December.  She had right knee replacement surgery in [place] last April. She states that she has had "rolling chills" ever since that surgery and has complained about it to her orthopedic surgeon. She states that she would take her temperature but had no documented fever until recently. She was on IV vancomycin for several weeks last September for her lower abdominal cellulitis and recalls that the chills resolved while she was on vancomycin only to recur after it was stopped.  One week ago she began to notice pain from her right knee down her shin associated with worsening subjective fever and chills. Her caregiver noted that she became somewhat confused. She was admitted last night and started on IV antibiotics and feels better today.   Review of Systems: Pertinent items are noted in HPI.  Past Medical History  Diagnosis Date  . Asthma   . Hypertension   . Chronic pain   . GERD (gastroesophageal reflux disease)     History  Substance Use Topics  . Smoking status: Former Smoker    Quit date: 08/21/1979  . Smokeless tobacco: Never Used  . Alcohol Use: No    History reviewed. No pertinent family  history. Allergies  Allergen Reactions  . Penicillins Anaphylaxis  . Antihistamines, Diphenhydramine-Type Other (See Comments)  . Aspirin Other (See Comments)    Severe abdominal pain and sweating  . Latex Hives  . Levofloxacin     Elbow tendons popped; long recovery time. Reports recently tolerating Cipro    OBJECTIVE: Blood pressure 114/62, pulse 92, temperature 99 F (37.2 C), temperature source Oral, resp. rate 18, height 5\' 4"  (1.626 m), weight 140.615 kg (310 lb), SpO2 100.00%. General: She is alert and in no distress sitting on the side of her bed. She is morbidly obese Skin: Her right arm PICC has been removed Lungs: Clear Cor: Regular S1 and S2 with an early 1/6 systolic murmur Abdomen: Obese, soft and  nontender. She has healed surgical incisions from previous laparoscopic surgery Extremities: She has scars over both knees. There is some faint erythema over the healed right knee incision. She has some swelling of her right lower extremity with faint erythema that she and her caregiver say has improved since yesterday. She has no pain with range of motion of her right knee  Microbiology: No results found for this or any previous visit (from the past 240 hour(s)).  Cliffton Asters, MD [facility] [phone] pager   [phone] cell 08/20/2012, 3:58 PM

## 2012-08-21 DIAGNOSIS — D649 Anemia, unspecified: Secondary | ICD-10-CM

## 2012-08-21 DIAGNOSIS — R509 Fever, unspecified: Secondary | ICD-10-CM

## 2012-08-21 LAB — CBC
MCH: 28.6 pg (ref 26.0–34.0)
MCHC: 32.8 g/dL (ref 30.0–36.0)
MCV: 87.1 fL (ref 78.0–100.0)
Platelets: 274 10*3/uL (ref 150–400)
RDW: 13.7 % (ref 11.5–15.5)

## 2012-08-21 LAB — URINE CULTURE
Colony Count: NO GROWTH
Culture: NO GROWTH

## 2012-08-21 LAB — COMPREHENSIVE METABOLIC PANEL
ALT: 14 U/L (ref 0–35)
AST: 15 U/L (ref 0–37)
Albumin: 2 g/dL — ABNORMAL LOW (ref 3.5–5.2)
Alkaline Phosphatase: 91 U/L (ref 39–117)
CO2: 24 mEq/L (ref 19–32)
Chloride: 104 mEq/L (ref 96–112)
Creatinine, Ser: 0.85 mg/dL (ref 0.50–1.10)
GFR calc non Af Amer: 75 mL/min — ABNORMAL LOW (ref >90)
Potassium: 4 mEq/L (ref 3.5–5.1)
Total Bilirubin: 0.4 mg/dL (ref 0.3–1.2)

## 2012-08-21 MED ORDER — ESOMEPRAZOLE MAGNESIUM 40 MG PO CPDR
40.0000 mg | DELAYED_RELEASE_CAPSULE | Freq: Two times a day (BID) | ORAL | Status: DC
  Administered 2012-08-21 – 2012-08-25 (×9): 40 mg via ORAL
  Filled 2012-08-21 (×11): qty 1

## 2012-08-21 NOTE — Progress Notes (Signed)
[facility]  Patient Status:  Pt has been active with [facility] up to the time of this admission on 08/19/12  [facility] is providing the following services: Pt has been receiving TPN with [facility] 04/05/12. Pt has been independent at home with her TPN. [facility] Infusion Coordinator had provided training prior to d/c home.   Pt opted to not have nursing due to cost of the SNV due to lack of insurance when initially transitioned home on TPN. Pt now has insurance and would benefit from SN in the home upon discharge home this admission.  If patient discharges after hours, please call [phone].   Jacqueline James 08/21/2012, 10:19 AM

## 2012-08-21 NOTE — Progress Notes (Signed)
[facility] PROGRESS NOTE  Jacqueline James ZOX:[id number] DOB: [birth year] DOA: 08/19/2012 PCP: No primary provider on file.  Patient has a complicated surgical history, [facility] did her gastric bypass in 02.  She then followed a surgeon at [facility] getting her care there until she was "let go".  She goes to [facility] and [facility] as well.  Currently looking for a new PCP.  Old records requested.  She has had a PICC line since last year getting TPN for "low protein"   Assessment/Plan: 1. Fever/chills- ? Etiology- PICC Line infection vs viral infection vs cellulitis appreciate ID's assistance 2. RLE cellulitis: vanc per ID, duplex negative for DVT, BC pending x 2, urine culture 3. HTN- stable 4. Chronic pain syndrome- follows with pain clinic 5. S/p gastric bypass- not tolerating PO- getting TPN- ? reason- requested old records from [facility] 6. Had indwelling PICC line- broken- removed- cultures pending 7. Leukocytosis- resolved 8. Anemia- ? Baseline- order B12, Fe panel (since s/p gastric bypass)  Code Status: full Family Communication: friend at bedside Disposition Plan: ? Await blood cultures   Consultants:  none  Procedures:  none  Antibiotics:    HPI/Subjective: Feeling better today, c/o pain in leg    Objective: Filed Vitals:   08/20/12 1300 08/20/12 1933 08/21/12 0550 08/21/12 0857  BP: 114/62 113/50 121/58   Pulse: 92 86 71   Temp: 99 F (37.2 C) 98 F (36.7 C) 98.9 F (37.2 C)   TempSrc:   Oral   Resp: 18 18 18    Height:      Weight:      SpO2: 100% 97% 96% 95%   No intake or output data in the 24 hours ending 08/21/12 0906 Filed Weights   08/19/12 2334  Weight: 140.615 kg (310 lb)    Exam:   General:  tearful  Cardiovascular: rrr  Respiratory: clear anterior  Abdomen: +BS, obese  Skin: mild redness in left LE, plus edmea  Data Reviewed: Basic Metabolic Panel:  Recent Labs Lab 08/19/12 1633 08/20/12 0650 08/21/12 0630   NA 134* 138 138  K 4.2 4.0 4.0  CL 99 105 104  CO2 24 26 24   GLUCOSE 120* 98 94  BUN 22 20 21   CREATININE 0.86 0.89 0.85  CALCIUM 8.9 8.7 8.3*   Liver Function Tests:  Recent Labs Lab 08/19/12 1633 08/20/12 0650 08/21/12 0630  AST 19 16 15   ALT 17 12 14   ALKPHOS 115 92 91  BILITOT 0.4 0.4 0.4  PROT 7.4 6.0 5.7*  ALBUMIN 2.7* 2.2* 2.0*   No results found for this basename: LIPASE, AMYLASE,  in the last 168 hours No results found for this basename: AMMONIA,  in the last 168 hours CBC:  Recent Labs Lab 08/19/12 1633 08/20/12 0650 08/21/12 0630  WBC 10.6* 7.4 5.5  NEUTROABS 8.6*  --   --   HGB 10.7* 9.0* 8.0*  HCT 33.4* 27.1* 24.4*  MCV 87.4 87.1 87.1  PLT 302 268 274   Cardiac Enzymes: No results found for this basename: CKTOTAL, CKMB, CKMBINDEX, TROPONINI,  in the last 168 hours BNP (last 3 results) No results found for this basename: PROBNP,  in the last 8760 hours CBG: No results found for this basename: GLUCAP,  in the last 168 hours  Recent Results (from the past 240 hour(s))  URINE CULTURE     Status: None   Collection Time    08/19/12  4:45 PM  Result Value Range Status   Specimen Description URINE, CATHETERIZED   Final   Special Requests Normal   Final   Culture  Setup Time 08/19/2012 22:24   Final   Colony Count NO GROWTH   Final   Culture NO GROWTH   Final   Report Status 08/21/2012 FINAL   Final     Studies: Dg Chest 2 View  08/19/2012  *RADIOLOGY REPORT*  Clinical Data: Fever.  CHEST - 2 VIEW  Comparison: None.  Findings: The lungs are well-aerated.  Mild opacity at the right lung base may reflect atelectasis or possibly mild pneumonia. There is no evidence of focal opacification, pleural effusion or pneumothorax.  The heart is mildly enlarged; the mediastinal contour is within normal limits.  No acute osseous abnormalities are seen.  The patient's right PICC is noted extending into the right atrium.  IMPRESSION:  1.  Mild opacity at the  right lung base may reflect atelectasis or possibly mild pneumonia. 2.  Mild cardiomegaly.   Original Report Authenticated By: Tonia Ghent, M.D.     Scheduled Meds: . ALPRAZolam  0.25 mg Oral QHS  . docusate sodium  100 mg Oral Q lunch  . fluticasone  1-2 spray Each Nare BID  . fluticasone  1-2 puff Inhalation BID  . heparin  5,000 Units Subcutaneous Q8H  . hydrocortisone  1 application Rectal BID  . irbesartan  75 mg Oral Daily  . magnesium hydroxide  30 mL Oral Q2H  . multivitamin with minerals  1 tablet Oral Daily  . pantoprazole  80 mg Oral Q1200  . potassium chloride  10 mEq Oral BID  . vancomycin  1,250 mg Intravenous Q12H  . vitamin B-12  100 mcg Oral Daily   Continuous Infusions:   Principal Problem:   Fever and chills Active Problems:   Cellulitis and abscess of lower leg   Obesity   Chronic pain   HTN (hypertension)   Anemia    Time spent: 45    [facility], Jacqueline James  [facility] Pager [phone]. If 8PM-8AM, please contact night-coverage at [email or web address], password [facility] 08/21/2012, 9:06 AM  LOS: 2 days

## 2012-08-21 NOTE — Progress Notes (Signed)
Patient ID: Jacqueline James, female   DOB: 06/15/1956, 57 y.o.   MRN: [id number]         [facility]    Date of Admission:  08/19/2012           Day 2 vancomycin  Principal Problem:   Fever and chills Active Problems:   Cellulitis and abscess of lower leg   Obesity   Chronic pain   HTN (hypertension)   Anemia   Subjective: She feels a little bit better but is still having pain in her right lower leg.  Objective: Temp:  [98 F (36.7 C)-98.9 F (37.2 C)] 98.8 F (37.1 C) (02/13 1300) Pulse Rate:  [71-86] 85 (02/13 1300) Resp:  [18] 18 (02/13 1300) BP: (113-127)/(50-65) 127/65 mmHg (02/13 1300) SpO2:  [95 %-97 %] 96 % (02/13 1300)  General: Alert and talkative Skin: She has 1+ nonpitting edema of her right leg below the knee associated with mild diffuse erythema Lungs: Clear Cor: Regular S1 and S2 no murmurs Abdomen: Soft and nontender  Lab Results Lab Results  Component Value Date   WBC 5.5 08/21/2012   HGB 8.0* 08/21/2012   HCT 24.4* 08/21/2012   MCV 87.1 08/21/2012   PLT 274 08/21/2012    Assessment: She seems to be defervescing on empiric therapy for right leg cellulitis.  Plan: 1. Continue vancomycin pending further observation and culture results  Cliffton Asters, MD [facility] for Infectious Disease [facility] [phone] pager   [phone] cell 08/21/2012, 2:50 PM

## 2012-08-22 DIAGNOSIS — D509 Iron deficiency anemia, unspecified: Secondary | ICD-10-CM

## 2012-08-22 LAB — CBC
Hemoglobin: 8.5 g/dL — ABNORMAL LOW (ref 12.0–15.0)
MCH: 28.2 pg (ref 26.0–34.0)
MCV: 87.7 fL (ref 78.0–100.0)
Platelets: 280 10*3/uL (ref 150–400)
RBC: 3.01 MIL/uL — ABNORMAL LOW (ref 3.87–5.11)
WBC: 5.1 10*3/uL (ref 4.0–10.5)

## 2012-08-22 LAB — CATH TIP CULTURE: Culture: NO GROWTH

## 2012-08-22 LAB — BASIC METABOLIC PANEL
CO2: 23 mEq/L (ref 19–32)
Calcium: 8.4 mg/dL (ref 8.4–10.5)
Chloride: 105 mEq/L (ref 96–112)
Creatinine, Ser: 0.8 mg/dL (ref 0.50–1.10)
Glucose, Bld: 88 mg/dL (ref 70–99)

## 2012-08-22 LAB — IRON AND TIBC: Saturation Ratios: 21 % (ref 20–55)

## 2012-08-22 LAB — FERRITIN: Ferritin: 175 ng/mL (ref 10–291)

## 2012-08-22 MED ORDER — FERROUS SULFATE 325 (65 FE) MG PO TABS
325.0000 mg | ORAL_TABLET | Freq: Two times a day (BID) | ORAL | Status: DC
  Filled 2012-08-22 (×8): qty 1

## 2012-08-22 MED ORDER — MAGNESIUM HYDROXIDE 400 MG/5ML PO SUSP: 30.0000 mL | ORAL | Status: DC | PRN

## 2012-08-22 NOTE — Progress Notes (Signed)
Vanc per Rx  Infectious Disease Vancomycin for RLE cellulitis. Tmax 98.8, WBC 5.1. Vanc alone per ID consult 2/12. outpt vanc last Sept for abd cellulitis Patient had indwelling PICC on admit, did not look infected per ED NP  Vanc 2/12>> Levaquin 2/12>> 02/13 Clinda 2/12>>2/12 (1 dose)  2/12 - cath tip cx - neg 2/11 - urine - neg 2/11 - blood x 2 - NGTD  Plan: - Continue vancomycin 1250mg  iv q12h.  If cont, will need to check trough this weekend.

## 2012-08-22 NOTE — Progress Notes (Signed)
[facility]  Subjective: Interval History: has complaints being cold.  Otherwise, she is improving today.  She is able to move her affected leg well and has been out of the chair to the bedside commode.  She is very concerned about when her PICC line will be replaced.   Objective: Vital signs in last 24 hours: Temp:  [98 F (36.7 C)-98.6 F (37 C)] 98 F (36.7 C) (02/14 0615) Pulse Rate:  [89-96] 96 (02/14 0615) Resp:  [18] 18 (02/14 0615) BP: (125-135)/(51-66) 135/66 mmHg (02/14 0615) SpO2:  [96 %-98 %] 98 % (02/14 0615)  PE:  Gen: Alert, awake, NAD, cushingoid body habitus HENT: NCAT, anicteric sclerae CVS: RR, NR, no murmur Pulm: CTAB anteriorly Abdomen: Obese, NT, +BS Ext: Thin.  Mild erythema to RLE with diffuse edema, this appears to be clearing as erythema was very mild today.  Pulses: 2+ in LE   Results for orders placed during the hospital encounter of 08/19/12 (from the past 24 hour(s))  FERRITIN     Status: None   Collection Time    08/22/12  8:40 AM      Result Value Range   Ferritin 175  10 - 291 ng/mL  IRON AND TIBC     Status: Abnormal   Collection Time    08/22/12  8:40 AM      Result Value Range   Iron 24 (*) 42 - 135 ug/dL   TIBC 147 (*) 829 - 562 ug/dL   Saturation Ratios 21  20 - 55 %   UIBC 92 (*) 125 - 400 ug/dL  VITAMIN Z30     Status: None   Collection Time    08/22/12  8:40 AM      Result Value Range   Vitamin B-12 597  211 - 911 pg/mL  CBC     Status: Abnormal   Collection Time    08/22/12  8:40 AM      Result Value Range   WBC 5.1  4.0 - 10.5 K/uL   RBC 3.01 (*) 3.87 - 5.11 MIL/uL   Hemoglobin 8.5 (*) 12.0 - 15.0 g/dL   HCT 86.5 (*) 78.4 - 69.6 %   MCV 87.7  78.0 - 100.0 fL   MCH 28.2  26.0 - 34.0 pg   MCHC 32.2  30.0 - 36.0 g/dL   RDW 29.5  28.4 - 13.2 %   Platelets 280  150 - 400 K/uL  BASIC METABOLIC PANEL     Status: Abnormal   Collection Time    08/22/12  8:40 AM      Result Value Range   Sodium 137  135 - 145  mEq/L   Potassium 3.8  3.5 - 5.1 mEq/L   Chloride 105  96 - 112 mEq/L   CO2 23  19 - 32 mEq/L   Glucose, Bld 88  70 - 99 mg/dL   BUN 16  6 - 23 mg/dL   Creatinine, Ser 4.40  0.50 - 1.10 mg/dL   Calcium 8.4  8.4 - 10.2 mg/dL   GFR calc non Af Amer 80 (*) >90 mL/min   GFR calc Af Amer >90  >90 mL/min    Studies/Results: Dg Chest 2 View  08/19/2012  *RADIOLOGY REPORT*  Clinical Data: Fever.  CHEST - 2 VIEW  Comparison: None.  Findings: The lungs are well-aerated.  Mild opacity at the right lung base may reflect atelectasis or possibly mild pneumonia. There is no evidence of focal opacification, pleural effusion  or pneumothorax.  The heart is mildly enlarged; the mediastinal contour is within normal limits.  No acute osseous abnormalities are seen.  The patient's right PICC is noted extending into the right atrium.  IMPRESSION:  1.  Mild opacity at the right lung base may reflect atelectasis or possibly mild pneumonia. 2.  Mild cardiomegaly.   Original Report Authenticated By: Tonia Ghent, M.D.     Scheduled Meds: . ALPRAZolam  0.25 mg Oral QHS  . docusate sodium  100 mg Oral Q lunch  . esomeprazole  40 mg Oral BID AC  . fluticasone  1-2 spray Each Nare BID  . fluticasone  1-2 puff Inhalation BID  . heparin  5,000 Units Subcutaneous Q8H  . hydrocortisone  1 application Rectal BID  . irbesartan  75 mg Oral Daily  . multivitamin with minerals  1 tablet Oral Daily  . potassium chloride  10 mEq Oral BID  . vancomycin  1,250 mg Intravenous Q12H  . vitamin B-12  100 mcg Oral Daily   Continuous Infusions:  PRN Meds:acetaminophen, levalbuterol, magnesium hydroxide, ondansetron (ZOFRAN) IV, ondansetron, oxyCODONE, polyvinyl alcohol, senna-docusate  Assessment/Plan:  1. RLE Cellulitis - Cath tip Cultures NGTD, BC NGTD, UC NGTD - Erythema improving with Vancomycin - ID consult noted - Will continue vancomycin, trough per pharmacy as patient is improving - Will need to consider replacement  of PICC line, however, it still remains unclear why she is on TPN (albumin) at home - She improved considerably today per her report  2. Anemia, iron deficiency - Will order iron supplementation, she is on colace  3. Leukocytosis - resolved 4. HTN - stable  Code: Full Disposition: Will need plan re: replacement of PICC line, viewed Care Everywhere notes and still unclear why she has long term parenteral nutrition.   (progress notes from early 2000s "not on file")   LOS: 3 days   Jacqueline James  Pager [phone], after 7pm, please call hospitalist on call.

## 2012-08-22 NOTE — Progress Notes (Signed)
Patient ID: Jacqueline James, female   DOB: [birth year], 57 y.o.   MRN: [id number]         [facility]    Date of Admission:  08/19/2012           Day 3 vancomycin  Principal Problem:   Fever and chills Active Problems:   Cellulitis and abscess of lower leg   Obesity   Chronic pain   HTN (hypertension)   Anemia  Subjective: She is feeling better with less right leg pain.  Objective: Temp:  [98 F (36.7 C)-98.6 F (37 C)] 98 F (36.7 C) (02/14 0615) Pulse Rate:  [89-96] 96 (02/14 0615) Resp:  [18] 18 (02/14 0615) BP: (125-135)/(51-66) 135/66 mmHg (02/14 0615) SpO2:  [96 %-98 %] 98 % (02/14 0615)  General: Alert and comfortable Right lower extremity has persistent 1+ pitting edema to the level of the knee which is unchanged. She has faint erythema in both lower legs. It is slightly worse than the right but better than it was on admission. She has no pain with range of motion of her right knee.  Lab Results Lab Results  Component Value Date   WBC 5.1 08/22/2012   HGB 8.5* 08/22/2012   HCT 26.4* 08/22/2012   MCV 87.7 08/22/2012   PLT 280 08/22/2012    Low it a well Microbiology: Recent Results (from the past 240 hour(s))  URINE CULTURE     Status: None   Collection Time    08/19/12  4:45 PM      Result Value Range Status   Specimen Description URINE, CATHETERIZED   Final   Special Requests Normal   Final   Culture  Setup Time 08/19/2012 22:24   Final   Colony Count NO GROWTH   Final   Culture NO GROWTH   Final   Report Status 08/21/2012 FINAL   Final  CULTURE, BLOOD (ROUTINE X 2)     Status: None   Collection Time    08/19/12  9:05 PM      Result Value Range Status   Specimen Description BLOOD FOREARM LEFT   Final   Special Requests BOTTLES DRAWN AEROBIC ONLY 10CC   Final   Culture  Setup Time 08/20/2012 09:16   Final   Culture     Final   Value:        BLOOD CULTURE RECEIVED NO GROWTH TO DATE CULTURE WILL BE HELD FOR 5 DAYS BEFORE ISSUING A  FINAL NEGATIVE REPORT   Report Status PENDING   Incomplete  CULTURE, BLOOD (ROUTINE X 2)     Status: None   Collection Time    08/19/12  9:11 PM      Result Value Range Status   Specimen Description BLOOD ARM LEFT   Final   Special Requests BOTTLES DRAWN AEROBIC AND ANAEROBIC 10CC   Final   Culture  Setup Time 08/20/2012 09:16   Final   Culture     Final   Value:        BLOOD CULTURE RECEIVED NO GROWTH TO DATE CULTURE WILL BE HELD FOR 5 DAYS BEFORE ISSUING A FINAL NEGATIVE REPORT   Report Status PENDING   Incomplete  CATH TIP CULTURE     Status: None   Collection Time    08/20/12  6:23 AM      Result Value Range Status   Specimen Description CATH TIP   Final   Special Requests NONE   Final   Culture NO  GROWTH 2 DAYS   Final   Report Status 08/22/2012 FINAL   Final   Assessment: She has defervesced and improved on vancomycin for possible right lower extremity cellulitis. I wonder if she is not close to her baseline. She states that she always has some swelling of that leg since her joint replacement surgery. I do not think that she has any evidence of infection of her prosthetic knee. I would consider stopping vancomycin and 24-48 hours if she continues to improve.  Plan: 1. Consider stopping vancomycin in the next 24-48 hours 2. Check final blood culture results  Cliffton Asters, MD [facility] [phone] pager   [phone] cell 08/22/2012, 2:34 PM

## 2012-08-22 NOTE — Progress Notes (Signed)
[facility]  Patient Status: Active patient with [facility] prior to admission  [facility] is providing the following services: Pt receiving [facility] for home TPN.  Per Orvil Feil, RPh with our TPN team at [facility], MD was planning to start TPN wean over the net 1-2 weeks.  [facility] Infusion Coordinator will continue to follow patient while in the hospital to support transition to home.   If patient discharges after hours, please call [phone].   Jacqueline James 08/22/2012, 3:27 PM

## 2012-08-23 LAB — CBC
Hemoglobin: 8.1 g/dL — ABNORMAL LOW (ref 12.0–15.0)
MCH: 28.3 pg (ref 26.0–34.0)
MCHC: 32.7 g/dL (ref 30.0–36.0)
MCV: 86.7 fL (ref 78.0–100.0)
RBC: 2.86 MIL/uL — ABNORMAL LOW (ref 3.87–5.11)

## 2012-08-23 LAB — BASIC METABOLIC PANEL
BUN: 12 mg/dL (ref 6–23)
CO2: 24 mEq/L (ref 19–32)
Calcium: 8.2 mg/dL — ABNORMAL LOW (ref 8.4–10.5)
Glucose, Bld: 83 mg/dL (ref 70–99)
Potassium: 3.7 mEq/L (ref 3.5–5.1)
Sodium: 139 mEq/L (ref 135–145)

## 2012-08-23 MED ORDER — BOOST / RESOURCE BREEZE PO LIQD
1.0000 | Freq: Three times a day (TID) | ORAL | Status: DC
  Administered 2012-08-24 (×2): 1 via ORAL

## 2012-08-23 NOTE — Progress Notes (Signed)
INITIAL NUTRITION ASSESSMENT  DOCUMENTATION CODES Per approved criteria  -Morbid Obesity   INTERVENTION: - Unclear why pt on TPN, awaiting earlier records. Low albumin may not be related to malnutrition, could be related to inflammation from cellulitis and illness. Albumin is a negative acute phase protein that decreases during illness and inflammation. Oral intake and weight history are better indicators of nutritional status compared to albumin level. Pt currently eating well, at least 50% of meals with reported good appetite, and pt willing to try nutritional supplements for increased protein needs. Recommend further discussion with [facility] MD as to why pt originally on TPN and why they are planning to wean TPN in the next week. Albumin infusions likely not beneficial if the cause of pt's low albumin is inflammation from illness.   - Resource Breeze TID - Educated pt on high protein food/beverage sources and provided pt with handout of this information - Ordered outpatient RD follow up at [facility] - RD to monitor intake   NUTRITION DIAGNOSIS: Increased nutrient needs related to fevers as evidenced by MD notes.   Goal: Pt to consume >90% of meals/supplements  Monitor:  Weights, labs, intake   Reason for Assessment: Consult  57 y.o. female  Admitting Dx: Fever and chills  ASSESSMENT: Pt admitted with fever and nausea for several weeks. 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 Pt on IV albumin for over a year. Pt with very little oral intake in the past couple weeks. Pt receiving TPN from [facility] since September 2013. Per [facility] notes, MD was planning to start TPN wean in the next 1-2 weeks. MD noted earlier in admission pt was eating biscuit from [facility]. Pt reports typically eating 3-4 meals/day and eats a lot of meat, primarily chicken.  Pt reports recently she didn't eat much for 3 days PTA but her appetite has returned. Pt reported to MD 80 pound unintended weight loss which pt reports started January of last year. Pt reports she lost weight because she was not eating due to stomach pain that was caused by adhesions from surgery. Pt reports the past several months her weight has been stable. Pt not interested in Ensure but interested in [employer] as long as it is diluted because she tries to avoid excess sugar since her gastric bypass surgery. Pt states she has not been taking her bariatric multivitamins at home but plans on resuming them at discharge.   Height: Ht Readings from Last 1 Encounters:  08/19/12 5\' 4"  (1.626 m)    Weight: Wt Readings from Last 1 Encounters:  08/19/12 310 lb (140.615 kg)    Ideal Body Weight: 120 lb  % Ideal Body Weight: 258  Wt Readings from Last 10 Encounters:  08/19/12 310 lb (140.615 kg)    Usual Body Weight: 390 lb last January per pt report  % Usual Body Weight: 79  BMI:  Body mass index is 53.19 kg/(m^2).  Estimated Nutritional Needs: Kcal: 1600-1750 Protein: 65-75g Fluid: 1.6-1.7L/day  Skin: Intact   Diet Order: General  EDUCATION NEEDS: -No education needs identified at this time   Intake/Output Summary (Last 24 hours) at 08/23/12 1808 Last data filed at 08/23/12 1007  Gross per 24 hour  Intake    240 ml  Output      0 ml  Net    240 ml    Last BM: 2/14  Labs:   Recent Labs Lab 08/21/12 0630 08/22/12 0840 08/23/12 0709  NA 138 137 139  K 4.0 3.8 3.7  CL 104 105 107  CO2 24 23 24   BUN 21 16 12   CREATININE 0.85 0.80 0.78  CALCIUM 8.3* 8.4 8.2*  GLUCOSE 94 88 83    CBG (last 3)  No results found for this basename: GLUCAP,  in the last 72 hours  Scheduled Meds: . ALPRAZolam  0.25 mg Oral QHS  . docusate sodium  100 mg Oral Q lunch  . esomeprazole  40 mg Oral BID AC  . ferrous sulfate  325 mg Oral BID WC  . fluticasone  1-2 spray Each  Nare BID  . fluticasone  1-2 puff Inhalation BID  . heparin  5,000 Units Subcutaneous Q8H  . hydrocortisone  1 application Rectal BID  . irbesartan  75 mg Oral Daily  . multivitamin with minerals  1 tablet Oral Daily  . potassium chloride  10 mEq Oral BID  . vancomycin  1,250 mg Intravenous Q12H  . vitamin B-12  100 mcg Oral Daily    Continuous Infusions:   Past Medical History  Diagnosis Date  . Asthma   . Hypertension   . Chronic pain   . GERD (gastroesophageal reflux disease)     Past Surgical History  Procedure Laterality Date  . Gastric bypass    . Replacement total knee Right 2013  . Hernia repair    . Orif ankle fracture    . Eye surgery      LASER     Levon Hedger MS, RD, LDN [phone] Weekend/After Hours Pager

## 2012-08-23 NOTE — Progress Notes (Signed)
Patient ID: Jacqueline James, female   DOB: 1955/11/26, 57 y.o.   MRN: [id number]         [facility] for Infectious Disease    Date of Admission:  08/19/2012           Day 4 vancomycin  Principal Problem:   Fever and chills Active Problems:   Cellulitis and abscess of lower leg   Obesity   Chronic pain   HTN (hypertension)   Anemia   . ALPRAZolam  0.25 mg Oral QHS  . docusate sodium  100 mg Oral Q lunch  . esomeprazole  40 mg Oral BID AC  . ferrous sulfate  325 mg Oral BID WC  . fluticasone  1-2 spray Each Nare BID  . fluticasone  1-2 puff Inhalation BID  . heparin  5,000 Units Subcutaneous Q8H  . hydrocortisone  1 application Rectal BID  . irbesartan  75 mg Oral Daily  . multivitamin with minerals  1 tablet Oral Daily  . potassium chloride  10 mEq Oral BID  . vancomycin  1,250 mg Intravenous Q12H  . vitamin B-12  100 mcg Oral Daily    Subjective: She is feeling much better with minimal right leg pain now.  Objective: Temp:  [98 F (36.7 C)-98.4 F (36.9 C)] 98.4 F (36.9 C) (02/15 1223) Pulse Rate:  [79-83] 81 (02/15 1223) Resp:  [18] 18 (02/15 1223) BP: (125-140)/(60-63) 140/63 mmHg (02/15 1223) SpO2:  [95 %-98 %] 96 % (02/15 1223)  General: She is alert and in good spirits Skin: Erythema of right lower leg is much resolved. She still has pitting edema but no pain when distracted  Lab Results Lab Results  Component Value Date   WBC 4.5 08/23/2012   HGB 8.1* 08/23/2012   HCT 24.8* 08/23/2012   MCV 86.7 08/23/2012   PLT 305 08/23/2012    Microbiology: Recent Results (from the past 240 hour(s))  URINE CULTURE     Status: None   Collection Time    08/19/12  4:45 PM      Result Value Range Status   Specimen Description URINE, CATHETERIZED   Final   Special Requests Normal   Final   Culture  Setup Time 08/19/2012 22:24   Final   Colony Count NO GROWTH   Final   Culture NO GROWTH   Final   Report Status 08/21/2012 FINAL   Final  CULTURE, BLOOD (ROUTINE X  2)     Status: None   Collection Time    08/19/12  9:05 PM      Result Value Range Status   Specimen Description BLOOD FOREARM LEFT   Final   Special Requests BOTTLES DRAWN AEROBIC ONLY 10CC   Final   Culture  Setup Time 08/20/2012 09:16   Final   Culture     Final   Value:        BLOOD CULTURE RECEIVED NO GROWTH TO DATE CULTURE WILL BE HELD FOR 5 DAYS BEFORE ISSUING A FINAL NEGATIVE REPORT   Report Status PENDING   Incomplete  CULTURE, BLOOD (ROUTINE X 2)     Status: None   Collection Time    08/19/12  9:11 PM      Result Value Range Status   Specimen Description BLOOD ARM LEFT   Final   Special Requests BOTTLES DRAWN AEROBIC AND ANAEROBIC 10CC   Final   Culture  Setup Time 08/20/2012 09:16   Final   Culture     Final  Value:        BLOOD CULTURE RECEIVED NO GROWTH TO DATE CULTURE WILL BE HELD FOR 5 DAYS BEFORE ISSUING A FINAL NEGATIVE REPORT   Report Status PENDING   Incomplete  CATH TIP CULTURE     Status: None   Collection Time    08/20/12  6:23 AM      Result Value Range Status   Specimen Description CATH TIP   Final   Special Requests NONE   Final   Culture NO GROWTH 2 DAYS   Final   Report Status 08/22/2012 FINAL   Final   Assessment: 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 Her mild bout of cellulitis is resolving nicely. I would discontinue vancomycin tomorrow. I've encouraged her to retry knee-high compressive stockings after discharge.  Plan: 1. Discontinue vancomycin tomorrow 2. Please call if I can be of further assistance while she is here.  Cliffton Asters, MD [facility] for Infectious Disease [facility] [phone] pager   [phone] cell 08/23/2012, 2:54 PM

## 2012-08-23 NOTE — Progress Notes (Signed)
[facility] Hospitalist Note  Subjective: Interval History: Feels much improved today. She is able to move her leg and ankle much better but says that she is still far from baseline. Patient is very anxious and wants to continue her TPN for malnutrition and hypoalbuminemia. She agreed to be evaluated by nutritionist for transition to PO supplements but very resistant throughout conversation. She has been out of the chair to the bedside commode.  She is very concerned about when her PICC line will be replaced. Patient says that she has lost 80 pounds and feels that her extremities are whiter due to malnutrition.  Objective: Vital signs in last 24 hours: Temp:  [98 F (36.7 C)-98.4 F (36.9 C)] 98 F (36.7 C) (02/15 0510) Pulse Rate:  [79-96] 83 (02/15 0510) Resp:  [18] 18 (02/15 0510) BP: (125-157)/(60-80) 125/60 mmHg (02/15 0510) SpO2:  [95 %-98 %] 95 % (02/15 0806)  PE:  Gen: Alert, awake, NAD, cushingoid body habitus HENT: NCAT, anicteric sclerae CVS: RR, NR, no murmur Pulm: CTAB anteriorly Abdomen: Obese, NT, +BS Ext: Thin.  No erythema to RLE with diffuse edema, this appears to be cleared up.  Pulses: 2+ in LE   Results for orders placed during the hospital encounter of 08/19/12 (from the past 24 hour(s))  BASIC METABOLIC PANEL     Status: Abnormal   Collection Time    08/23/12  7:09 AM      Result Value Range   Sodium 139  135 - 145 mEq/L   Potassium 3.7  3.5 - 5.1 mEq/L   Chloride 107  96 - 112 mEq/L   CO2 24  19 - 32 mEq/L   Glucose, Bld 83  70 - 99 mg/dL   BUN 12  6 - 23 mg/dL   Creatinine, Ser 1.61  0.50 - 1.10 mg/dL   Calcium 8.2 (*) 8.4 - 10.5 mg/dL   GFR calc non Af Amer >90  >90 mL/min   GFR calc Af Amer >90  >90 mL/min  CBC     Status: Abnormal   Collection Time    08/23/12  7:09 AM      Result Value Range   WBC 4.5  4.0 - 10.5 K/uL   RBC 2.86 (*) 3.87 - 5.11 MIL/uL   Hemoglobin 8.1 (*) 12.0 - 15.0 g/dL   HCT 09.6 (*) 04.5 - 40.9 %   MCV 86.7  78.0 - 100.0 fL    MCH 28.3  26.0 - 34.0 pg   MCHC 32.7  30.0 - 36.0 g/dL   RDW 81.1  91.4 - 78.2 %   Platelets 305  150 - 400 K/uL    Studies/Results: Dg Chest 2 View  08/19/2012  *RADIOLOGY REPORT*  Clinical Data: Fever.  CHEST - 2 VIEW  Comparison: None.  Findings: The lungs are well-aerated.  Mild opacity at the right lung base may reflect atelectasis or possibly mild pneumonia. There is no evidence of focal opacification, pleural effusion or pneumothorax.  The heart is mildly enlarged; the mediastinal contour is within normal limits.  No acute osseous abnormalities are seen.  The patient's right PICC is noted extending into the right atrium.  IMPRESSION:  1.  Mild opacity at the right lung base may reflect atelectasis or possibly mild pneumonia. 2.  Mild cardiomegaly.   Original Report Authenticated By: Tonia Ghent, M.D.     Scheduled Meds: . ALPRAZolam  0.25 mg Oral QHS  . docusate sodium  100 mg Oral Q lunch  . esomeprazole  40 mg Oral BID AC  . ferrous sulfate  325 mg Oral BID WC  . fluticasone  1-2 spray Each Nare BID  . fluticasone  1-2 puff Inhalation BID  . heparin  5,000 Units Subcutaneous Q8H  . hydrocortisone  1 application Rectal BID  . irbesartan  75 mg Oral Daily  . multivitamin with minerals  1 tablet Oral Daily  . potassium chloride  10 mEq Oral BID  . vancomycin  1,250 mg Intravenous Q12H  . vitamin B-12  100 mcg Oral Daily   Continuous Infusions:  PRN Meds:acetaminophen, levalbuterol, magnesium hydroxide, ondansetron (ZOFRAN) IV, ondansetron, oxyCODONE, polyvinyl alcohol, senna-docusate  Assessment/Plan:  1. RLE Cellulitis - Cath tip Cultures NGTD, BC NGTD, UC NGTD - Erythema improved with Vancomycin - Will d/c vancomycin tomorrow as per ID note -Will have nutrition reevaluate her status prior to putting PICC line again  2. Anemia, iron deficiency - Will order iron supplementation, she is on colace  3. Leukocytosis - resolved  4. HTN - stable  5.  Malnutrition Albumin of 2 on CMP. -Will repeat MP in AM -get nutrition consult on comment on need for IV albumin supplementation at this time?  Code: Full Disposition: D/C vancomycin tomorrow, will consult PT for evaluation for SNF vs Home   LOS: 4 days   Lars Mage  Pager [phone], after 7pm, please call hospitalist on call.

## 2012-08-23 NOTE — Progress Notes (Signed)
PT Cancellation/Discharge Note  Patient Details Name: Jacqueline James MRN: [id number] DOB: 1955-08-08   Cancelled Treatment:    Reason Eval/Treat Not Completed: Other (comment) (pt refused need for PT and states she is at baseline. Pt reports she doesn't really walk has hospital bed, WC, RW, 3in 1 and aides at home and has been mobilizing independently acutely. Pt denied any need for therapy at this time and states she has been through it before and will return home with her DME and aide. Should pt status change please reorder.)  Delaney Meigs, PT [phone]

## 2012-08-24 LAB — COMPREHENSIVE METABOLIC PANEL
CO2: 21 mEq/L (ref 19–32)
Calcium: 8.3 mg/dL — ABNORMAL LOW (ref 8.4–10.5)
Creatinine, Ser: 0.8 mg/dL (ref 0.50–1.10)
GFR calc Af Amer: >90 mL/min (ref >90)
GFR calc non Af Amer: 80 mL/min — ABNORMAL LOW (ref >90)
Glucose, Bld: 78 mg/dL (ref 70–99)
Total Protein: 6.4 g/dL (ref 6.0–8.3)

## 2012-08-24 NOTE — Progress Notes (Signed)
[facility] Hospitalist Note  Subjective: Interval History: Patient's leg feels much better. Patient has swelling which is likely from chronic edema ever since her knee surgery. I do not believe and see any medical reason to put a PICC line in her at this time given the risk vs benefits of it. I believe that after evaluation from nutrition and them suggesting oral nutritional supplementation should be able to maintain her nutritional status.   Objective: Vital signs in last 24 hours: Temp:  [97.9 F (36.6 C)-98.4 F (36.9 C)] 97.9 F (36.6 C) (02/16 0656) Pulse Rate:  [78-86] 78 (02/16 0656) Resp:  [17-18] 18 (02/16 0656) BP: (140)/(63-74) 140/64 mmHg (02/16 0656) SpO2:  [93 %-96 %] 95 % (02/16 0812)  PE:  Gen: Alert, awake, NAD, cushingoid body habitus HENT: NCAT, anicteric sclerae CVS: RR, NR, no murmur Pulm: CTAB anteriorly Abdomen: Obese, NT, +BS Ext: Thin.  No erythema to RLE with diffuse edema, this appears to be cleared up.  Pulses: 2+ in LE   Results for orders placed during the hospital encounter of 08/19/12 (from the past 24 hour(s))  COMPREHENSIVE METABOLIC PANEL     Status: Abnormal   Collection Time    08/24/12  5:06 AM      Result Value Range   Sodium 139  135 - 145 mEq/L   Potassium 3.9  3.5 - 5.1 mEq/L   Chloride 106  96 - 112 mEq/L   CO2 21  19 - 32 mEq/L   Glucose, Bld 78  70 - 99 mg/dL   BUN 9  6 - 23 mg/dL   Creatinine, Ser 1.61  0.50 - 1.10 mg/dL   Calcium 8.3 (*) 8.4 - 10.5 mg/dL   Total Protein 6.4  6.0 - 8.3 g/dL   Albumin 2.2 (*) 3.5 - 5.2 g/dL   AST 33  0 - 37 U/L   ALT 22  0 - 35 U/L   Alkaline Phosphatase 101  39 - 117 U/L   Total Bilirubin 0.3  0.3 - 1.2 mg/dL   GFR calc non Af Amer 80 (*) >90 mL/min   GFR calc Af Amer >90  >90 mL/min    Studies/Results: Dg Chest 2 View  08/19/2012  *RADIOLOGY REPORT*  Clinical Data: Fever.  CHEST - 2 VIEW  Comparison: None.  Findings: The lungs are well-aerated.  Mild opacity at the right lung base may reflect  atelectasis or possibly mild pneumonia. There is no evidence of focal opacification, pleural effusion or pneumothorax.  The heart is mildly enlarged; the mediastinal contour is within normal limits.  No acute osseous abnormalities are seen.  The patient's right PICC is noted extending into the right atrium.  IMPRESSION:  1.  Mild opacity at the right lung base may reflect atelectasis or possibly mild pneumonia. 2.  Mild cardiomegaly.   Original Report Authenticated By: Tonia Ghent, M.D.     Scheduled Meds: . ALPRAZolam  0.25 mg Oral QHS  . docusate sodium  100 mg Oral Q lunch  . esomeprazole  40 mg Oral BID AC  . feeding supplement  1 Container Oral TID BM  . ferrous sulfate  325 mg Oral BID WC  . fluticasone  1-2 spray Each Nare BID  . fluticasone  1-2 puff Inhalation BID  . heparin  5,000 Units Subcutaneous Q8H  . hydrocortisone  1 application Rectal BID  . irbesartan  75 mg Oral Daily  . multivitamin with minerals  1 tablet Oral Daily  . potassium chloride  10 mEq Oral BID  . vancomycin  1,250 mg Intravenous Q12H  . vitamin B-12  100 mcg Oral Daily   Continuous Infusions:  PRN Meds:acetaminophen, levalbuterol, magnesium hydroxide, ondansetron (ZOFRAN) IV, ondansetron, oxyCODONE, polyvinyl alcohol, senna-docusate  Assessment/Plan:  1. RLE Cellulitis - Cath tip Cultures NGTD, BC NGTD, UC NGTD - Erythema improved with Vancomycin - Will d/c vancomycin today.  -No oral antibiotics recommended by infectious disease at this time. -Compression stocking upto knees recommended at follow up  2. Anemia, iron deficiency - On iron supplementation, she is on colace  3. Leukocytosis - resolved  4. HTN - stable  5. Malnutrition Albumin of 2.2 on CMP. -Supplementation as per nutrition recommendations. -Patient will need an outpatient appointment with nutritionist to be evaluated for continued hypoalbuminemia and malnutrition.   Code: Full Disposition: Patient wants to go home tomorrow  as she is unable to get a ride home today.   LOS: 5 days   Lars Mage  Pager [phone], after 7pm, please call hospitalist on call.

## 2012-08-25 MED ORDER — UNJURY CHICKEN SOUP POWDER
2.0000 [oz_av] | Freq: Four times a day (QID) | ORAL | Status: DC
  Filled 2012-08-25 (×3): qty 27

## 2012-08-25 NOTE — Progress Notes (Signed)
Patient provided with discharge instructions and will be discharged with helper to home at this time.

## 2012-08-25 NOTE — Discharge Summary (Signed)
Physician Discharge Summary  Jacqueline James ZOX:[id number] DOB: 1955-12-03 DOA: 08/19/2012  PCP: No primary provider on file.  Admit date: 08/19/2012 Discharge date: 08/25/2012  Recommendations for Outpatient Follow-up:  1. Pt will need to follow up with PCP in 2-3 weeks post discharge 2. Please obtain BMP to evaluate electrolytes and kidney function 3. Please also check CBC to evaluate Hg and Hct levels 4. Please note that pt has been rather aggressive verbally and insisting on PICC line for TNA feedings. No physician has agreed to do so. Pt has been verbally abusive to staff and indicating that we did not address her needs. [facility] provided records to indicate that no previous physicians wanted to continue TNA feedings, pt has been seen by doctors in [place] and [place] area, [facility]. Her bariatric surgeon dismissed her from the clinic due to unknown reasons to [place], and has agreed on discontinuation of PICC line and TNA.  Discharge Diagnoses: Cellulitis Principal Problem:   Fever and chills Active Problems:   Cellulitis and abscess of lower leg   Obesity   Chronic pain   HTN (hypertension)   Anemia    Discharge Condition: Stable  Diet recommendation: Heart healthy diet discussed in details   History of present illness:  Pt is 57 y.o. Female who describes fever and chills for the last 2 weeks. There is no respiratory symptoms. In the last 3-4 days she has had redness in the right lower leg. She has a PICC line in place in the right arm and a piece of the exterior line has become broken. This lady has been on intravenous albumin 4 over a year. She had gastric bypass surgery several years ago and apparently has had complications from it. She also has had adhesions in her abdomen which is required surgery. She has had poor by mouth intake in the last couple weeks. When she was seen in the emergency room, she had a fever of 101.1.  Hospital Course:  1. RLE Cellulitis  - Cath tip  Cultures NGTD, BC NGTD, UC NGTD  - Erythema improved with Vancomycin  - d/c vancomycin  -No oral antibiotics recommended by infectious disease at this time.  -Compression stocking upto knees recommended at follow up  2. Anemia, iron deficiency - On iron supplementation 3. Leukocytosis - resolved  4. HTN - stable  5. Malnutrition - pt insisting on PICC line before discharge in order to continue TNA feeding, I have discussed this with [facility] to find out who put the order for TNA, order was placed by Dr. Smitty Cords in November 2013 and he did not want to continue TNA feeding past December so pt tried another physician and has been trying to find a doctor who will provide prescription for this. Physicians have agreed she does not need PICC line and PO intake has been consistently encouraged while in the hospital .   Procedures/Studies: Dg Chest 2 View 08/19/2012   1.  Mild opacity at the right lung base may reflect atelectasis or possibly mild pneumonia. 2.  Mild cardiomegaly.   Original Report Authenticated By: Tonia Ghent, M.D.    Consultations:  ID  Antibiotics:  None  Discharge Exam: Filed Vitals:   08/25/12 0553  BP: 143/80  Pulse: 93  Temp: 98 F (36.7 C)  Resp: 18   Filed Vitals:   08/24/12 1331 08/24/12 1345 08/24/12 2119 08/25/12 0553  BP:  134/69 148/75 143/80  Pulse:  84 89 93  Temp: 97.8 F (36.6  C)  98.7 F (37.1 C) 98 F (36.7 C)  TempSrc: Oral  Oral Oral  Resp:  16 18 18   Height:      Weight:      SpO2:  100% 99% 98%    General: Pt is alert, follows commands appropriately, not in acute distress Cardiovascular: Regular rate and rhythm, S1/S2 +, no murmurs, no rubs, no gallops Respiratory: Clear to auscultation bilaterally, no wheezing, no crackles, no rhonchi Abdominal: Soft, non tender, non distended, bowel sounds +, no guarding Extremities: no edema, no cyanosis, pulses palpable bilaterally DP and PT Neuro: Grossly nonfocal  Discharge  Instructions  Discharge Orders   Future Appointments Provider Department Dept Phone   09/02/2012 2:00 PM Orville Govern, NP [facility] Nicholes Rough [phone]   Future Orders Complete By Expires     Diet - low sodium heart healthy  As directed     Increase activity slowly  As directed         Medication List    TAKE these medications       acetaminophen 500 MG tablet  Commonly known as:  TYLENOL  Take 500-1,000 mg by mouth every 3 (three) hours as needed for pain.     ALPRAZolam 0.5 MG tablet  Commonly known as:  XANAX  Take 1.25-2 mg by mouth at bedtime.     docusate sodium 100 MG capsule  Commonly known as:  COLACE  Take 100 mg by mouth daily with lunch.     FLOVENT HFA 110 MCG/ACT inhaler  Generic drug:  fluticasone  Inhale 1-2 puffs into the lungs 2 (two) times daily.     fluticasone 50 MCG/ACT nasal spray  Commonly known as:  FLONASE  Place 1-2 sprays into the nose 2 (two) times daily.     GenTeal Gel  Place 1 drop into both eyes 5 (five) times daily as needed. For dry eyes     levalbuterol 45 MCG/ACT inhaler  Commonly known as:  XOPENEX HFA  Inhale 1-2 puffs into the lungs every 4 (four) hours as needed for wheezing or shortness of breath.     magnesium hydroxide 400 MG/5ML suspension  Commonly known as:  MILK OF MAGNESIA  Take 30 mLs by mouth every 2 (two) hours.     MICARDIS 40 MG tablet  Generic drug:  telmisartan  Take 20 mg by mouth daily as needed. If blood pressure is over 138     multivitamin with minerals Tabs  Take 1 tablet by mouth daily.     NEXIUM 40 MG capsule  Generic drug:  esomeprazole  Take 40 mg by mouth 2 (two) times daily.     ondansetron 4 MG tablet  Commonly known as:  ZOFRAN  Take 4 mg by mouth every 4 (four) hours as needed for nausea.     OVER THE COUNTER MEDICATION  Take 400 mg by mouth daily. folate     OVER THE COUNTER MEDICATION  Apply 1 application topically 2 (two) times daily. Fanny cream     Oxycodone HCl  10 MG Tabs  Take 10 mg by mouth every 4 (four) hours as needed. For pain     potassium chloride 10 MEQ CR capsule  Commonly known as:  MICRO-K  Take 10 mEq by mouth daily.     PROCTOSOL HC 2.5 % rectal cream  Generic drug:  hydrocortisone  Place 1 application rectally 2 (two) times daily.     sennosides-docusate sodium 8.6-50 MG tablet  Commonly known as:  SENOKOT-S  Take 1 tablet by mouth 3 (three) times daily as needed for constipation.     VITAMIN B-12 PO  Take 1 tablet by mouth daily.           Follow-up Information   Please follow up. (As needed if symptoms worsen)        The results of significant diagnostics from this hospitalization (including imaging, microbiology, ancillary and laboratory) are listed below for reference.     Microbiology: Recent Results (from the past 240 hour(s))  URINE CULTURE     Status: None   Collection Time    08/19/12  4:45 PM      Result Value Range Status   Specimen Description URINE, CATHETERIZED   Final   Special Requests Normal   Final   Culture  Setup Time 08/19/2012 22:24   Final   Colony Count NO GROWTH   Final   Culture NO GROWTH   Final   Report Status 08/21/2012 FINAL   Final  CULTURE, BLOOD (ROUTINE X 2)     Status: None   Collection Time    08/19/12  9:05 PM      Result Value Range Status   Specimen Description BLOOD FOREARM LEFT   Final   Special Requests BOTTLES DRAWN AEROBIC ONLY 10CC   Final   Culture  Setup Time 08/20/2012 09:16   Final   Culture     Final   Value:        BLOOD CULTURE RECEIVED NO GROWTH TO DATE CULTURE WILL BE HELD FOR 5 DAYS BEFORE ISSUING A FINAL NEGATIVE REPORT   Report Status PENDING   Incomplete  CULTURE, BLOOD (ROUTINE X 2)     Status: None   Collection Time    08/19/12  9:11 PM      Result Value Range Status   Specimen Description BLOOD ARM LEFT   Final   Special Requests BOTTLES DRAWN AEROBIC AND ANAEROBIC 10CC   Final   Culture  Setup Time 08/20/2012 09:16   Final   Culture      Final   Value:        BLOOD CULTURE RECEIVED NO GROWTH TO DATE CULTURE WILL BE HELD FOR 5 DAYS BEFORE ISSUING A FINAL NEGATIVE REPORT   Report Status PENDING   Incomplete  CATH TIP CULTURE     Status: None   Collection Time    08/20/12  6:23 AM      Result Value Range Status   Specimen Description CATH TIP   Final   Special Requests NONE   Final   Culture NO GROWTH 2 DAYS   Final   Report Status 08/22/2012 FINAL   Final     Labs: Basic Metabolic Panel:  Recent Labs Lab 08/20/12 0650 08/21/12 0630 08/22/12 0840 08/23/12 0709 08/24/12 0506  NA 138 138 137 139 139  K 4.0 4.0 3.8 3.7 3.9  CL 105 104 105 107 106  CO2 26 24 23 24 21   GLUCOSE 98 94 88 83 78  BUN 20 21 16 12 9   CREATININE 0.89 0.85 0.80 0.78 0.80  CALCIUM 8.7 8.3* 8.4 8.2* 8.3*   Liver Function Tests:  Recent Labs Lab 08/19/12 1633 08/20/12 0650 08/21/12 0630 08/24/12 0506  AST 19 16 15  33  ALT 17 12 14 22   ALKPHOS 115 92 91 101  BILITOT 0.4 0.4 0.4 0.3  PROT 7.4 6.0 5.7* 6.4  ALBUMIN 2.7* 2.2* 2.0* 2.2*   CBC:  Recent Labs Lab  08/19/12 1633 08/20/12 0650 08/21/12 0630 08/22/12 0840 08/23/12 0709  WBC 10.6* 7.4 5.5 5.1 4.5  NEUTROABS 8.6*  --   --   --   --   HGB 10.7* 9.0* 8.0* 8.5* 8.1*  HCT 33.4* 27.1* 24.4* 26.4* 24.8*  MCV 87.4 87.1 87.1 87.7 86.7  PLT 302 268 274 280 305     SIGNED: Time coordinating discharge: Over 30 minutes  Debbora Presto, MD  [facility] 08/25/2012, 11:22 AM Pager [phone]  If 7PM-7AM, please contact night-coverage [email or web address] Password [email or web address]

## 2012-08-25 NOTE — Progress Notes (Signed)
Pt has refused to drink Resource this wkend.Snacking on cheese puffs between meals.States she believes she needs picc line and tna again after d/c. Linward Headland D

## 2012-08-25 NOTE — Progress Notes (Signed)
NUTRITION FOLLOW UP  Intervention:   1.  Supplements; discontinue [employer].  Will order Onalee Hua, 4 times daily for pt.  Recommend continuing at d/c. 2.  Other providers; referral previously placed for outpt nutrition-related care 3.  General healthful diet; encouraged intake.  Pt with several barriers to improving intake including mobility, ability for self-care, poor sleeping habits, and decreased appetite.  These were discussed with pt.  Nutrition Dx:   Increased nutrient needs, ongoing  Monitor:   1.  Food/Beverage; improvement in intake to >50% of meals consistently.  Pt able to tolerate supplement.  Assessment:   Pt admitted with fever and nausea for several weeks. 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 Pt on IV albumin for over a year. Pt with very little oral intake in the past couple weeks. Pt receiving TPN from [facility] since September 2013. Per [facility] notes, MD was planning to start TPN wean in the next 1-2 weeks.  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  Pt is not able to eat >3 meals/day per her report due to poor appetite, food intolerance, sleep habits, decreased ability for self-care, and decreased mobility.  Pt and caregiver at bedside state that with the nutrition provided by TPN over the past several months all of these limitations have improved.  Pt strongly believes that her decline in status over the past year (mainly 2013) was due to malnutrition and that the TPN was necessary for her to recover.  In addition to poor intake, pt also has had recent increases in nutrient demand from surgeries- knee and bowel surgery.  RD discussed ways to improve protein intake, however pt and caregiver state "we've tried everything."  Pt is very concerned that without the TPN she will return to her previous state of health which  she describes as "near death."    RD discussed the hesitation in using TPN and the need to improve PO intake.  Pt states she was hoping to be weaned from the TPN in "1-2 months."  When asked how she would be able to achieve adequate intake in the future vs now and pt states "I would just feel better."  It is hard for pt to see any limitations or consequences from resuming/continuing TPN.  She understands that she will have to resume taking her PO bariatric vitamins if TPN is not resumed.  If pt is not to resume TPN, she would greatly benefit from discussing her current treatment plan and goal with MD.  Height: Ht Readings from Last 1 Encounters:  08/19/12 5\' 4"  (1.626 m)    Weight Status:   Wt Readings from Last 1 Encounters:  08/19/12 310 lb (140.615 kg)    Re-estimated needs:  Kcal: 1600-1750 Protein: 65-75g Fluid: 1.6-1.7 L/day  Skin: intact, past incision to knee  Diet Order: General  No intake or output data in the 24 hours ending 08/25/12 1259  Last BM: 2/15  Labs:   Recent Labs Lab 08/22/12 0840 08/23/12 0709 08/24/12 0506  NA 137 139 139  K 3.8 3.7 3.9  CL 105 107 106  CO2 23 24 21   BUN 16 12 9   CREATININE 0.80 0.78 0.80  CALCIUM 8.4 8.2* 8.3*  GLUCOSE 88 83 78    CBG (last 3)  No results found for this basename: GLUCAP,  in the last 72 hours  Scheduled Meds: . ALPRAZolam  0.25 mg Oral QHS  . docusate sodium  100 mg Oral Q lunch  . esomeprazole  40 mg Oral BID AC  . feeding supplement  1 Container Oral TID BM  . ferrous sulfate  325 mg Oral BID WC  . fluticasone  1-2 spray Each Nare BID  . fluticasone  1-2 puff Inhalation BID  . heparin  5,000 Units Subcutaneous Q8H  . hydrocortisone  1 application Rectal BID  . irbesartan  75 mg Oral Daily  . multivitamin with minerals  1 tablet Oral Daily  . potassium chloride  10 mEq Oral BID  . protein supplement  2 oz Oral QID  . vitamin B-12  100 mcg Oral Daily    Continuous Infusions:   Loyce Dys, MS RD LDN Clinical Inpatient Dietitian Pager: [phone] Weekend/After hours pager: [phone]

## 2012-08-25 NOTE — Progress Notes (Signed)
Entered order for OP RD, bariatric preferred. Thank you, Lenor Coffin, RN, CNS, Diabetes Coordinator [phone])

## 2012-08-26 LAB — CULTURE, BLOOD (ROUTINE X 2)

## 2012-08-27 NOTE — Telephone Encounter (Signed)
Left another message for return call.

## 2012-09-01 NOTE — Telephone Encounter (Signed)
Patient has appointment 2/25 with Raquel Rey.

## 2012-09-02 ENCOUNTER — Ambulatory Visit (INDEPENDENT_AMBULATORY_CARE_PROVIDER_SITE_OTHER): Payer: Medicare Other | Admitting: Adult Health

## 2012-09-02 ENCOUNTER — Encounter: Payer: Self-pay | Admitting: Adult Health

## 2012-09-02 VITALS — BP 119/88 | HR 88 | Temp 98.6°F | Resp 18 | Wt 303.0 lb

## 2012-09-02 DIAGNOSIS — I10 Essential (primary) hypertension: Secondary | ICD-10-CM

## 2012-09-02 DIAGNOSIS — L02419 Cutaneous abscess of limb, unspecified: Secondary | ICD-10-CM

## 2012-09-02 DIAGNOSIS — E46 Unspecified protein-calorie malnutrition: Secondary | ICD-10-CM

## 2012-09-02 DIAGNOSIS — G8929 Other chronic pain: Secondary | ICD-10-CM

## 2012-09-02 DIAGNOSIS — L03119 Cellulitis of unspecified part of limb: Secondary | ICD-10-CM

## 2012-09-02 NOTE — Progress Notes (Signed)
Subjective:    Patient ID: Jacqueline James, female    DOB: Mar 16, 1956, 57 y.o.   MRN: [id number]  HPI  Patient is a 57 year old female with multiple medical problems including asthma, hypertension, GERD, obesity s/p gastric bypass in 2002, headaches, obstructive sleep apnea, degenerative arthritis, chronic pain. Patient was recently hospitalized from 08/19/12 - 08/23/12 at [facility] for her right lower extremity cellulitis. Patient presents to clinic today to establish care and for post hospital visit.   Patient reports that she previously had a PICC line where she was getting TPN. This line was removed during hospitalization when they noticed that the line was broken. They also did cultures on the tip which came back negative. Patient was very concerned about having this PICC line reinserted as she feels that her malnutrition needs to be managed with TPN. During hospitalization, the patient was seen by a registered dietitian with recommendations for oral intake and additional oral supplementation as a means of treating her protein malnutrition. Recommendation was made to not reinsert the PICC line and start TPN as the risks outweigh benefits. It was believed that patient should be able to maintain nutrition through oral intake. She was also observed eating approximately 50% of her meals. She was also observed eating food from outside sources. Further recommendation was for her to see outpatient nutritionist.   Past Surgical History  Procedure Laterality Date  . Gastric bypass  2002    [place]  . Replacement total knee Right 2013    Dr. Rosita Kea  . Hernia repair  06/2011    Dr. Renda Rolls  . Orif ankle fracture  1980  . Eye surgery      LASER  . Cholecystectomy  2003  . Appendectomy    . Exploratory laparotomy with lysis of adhesions  01/2012    [facility]    Family History  Problem Relation Age of Onset  . Heart disease Mother   . Heart disease Father   . Cancer Father     History    Social History  . Marital Status: Single    Spouse Name: N/A    Number of Children: N/A  . Years of Education: N/A   Occupational History  . Not on file.   Social History Main Topics  . Smoking status: Former Smoker    Quit date: 08/21/1979  . Smokeless tobacco: Never Used  . Alcohol Use: No  . Drug Use: No  . Sexually Active: Not on file   Other Topics Concern  . Not on file   Social History Narrative  . No narrative on file     Review of Systems  Constitutional: Positive for appetite change and fatigue. Negative for fever and chills.       Wheelchair bound. Able to stand, pivot and transfer.  HENT: Positive for congestion, postnasal drip and sinus pressure.   Eyes:       Hx of retinal vein occlusion followed by Dr. Melinda Crutch.  Respiratory: Negative for cough, chest tightness, shortness of breath and wheezing.   Cardiovascular: Positive for leg swelling. Negative for chest pain.  Gastrointestinal: Positive for abdominal distention. Negative for nausea, vomiting, blood in stool and anal bleeding.       Increased burping. Pain is improved since they repaired adhesions. Reports she can only eat very small portions.  Endocrine: Negative.   Genitourinary: Negative for dysuria, frequency, hematuria and flank pain.  Musculoskeletal: Positive for back pain, joint swelling, arthralgias and gait problem.  Skin: Positive for  pallor. Negative for rash and wound.       Patient reports pallor in bilateral LE.  Allergic/Immunologic: Negative for immunocompromised state.  Neurological: Positive for weakness and headaches. Negative for tremors, seizures, syncope, speech difficulty and numbness.  Hematological: Negative.   Psychiatric/Behavioral: Positive for sleep disturbance. Negative for suicidal ideas, hallucinations and confusion. The patient is nervous/anxious.     BP 119/88  Pulse 88  Temp(Src) 98.6 F (37 C) (Oral)  Resp 18  Wt 303 lb (137.44 kg)  BMI 51.98 kg/m2  SpO2  92%     Objective:   Physical Exam  Constitutional:  Obesity, BMI 52. Pt is s/p gastric bypass 2002.  HENT:  Head: Normocephalic and atraumatic.  Eyes:  Mild exophthalmos.  Neck: Normal range of motion. No tracheal deviation present.  Cardiovascular: Normal rate, regular rhythm and normal heart sounds.   Pulmonary/Chest: She has no wheezes.  Breath sounds clear anteriorly and posteriorly. Diminished posterior bases bilaterally and may be difficult auscultation 2/2 body habitus.  Abdominal: Bowel sounds are normal.  Skin: Skin is warm and dry. No rash noted. No erythema.  Psychiatric: She has a normal mood and affect. Her behavior is normal. Thought content normal.         Assessment & Plan:

## 2012-09-07 ENCOUNTER — Encounter: Payer: Self-pay | Admitting: Adult Health

## 2012-09-07 DIAGNOSIS — E46 Unspecified protein-calorie malnutrition: Secondary | ICD-10-CM | POA: Insufficient documentation

## 2012-09-07 NOTE — Assessment & Plan Note (Signed)
Seems to be well controlled on her current meds. No changes at this time.

## 2012-09-07 NOTE — Assessment & Plan Note (Signed)
Followed at [facility].

## 2012-09-07 NOTE — Assessment & Plan Note (Addendum)
TPN not indicated at this time per recent hospital records. Seen by several MDs as well as RD. Risks outweigh benefits and patient was able to take oral supplements. Recommend that she continue with outpatient nutritionist evaluation. It is still unclear why she was started on TPN. She did have surgery with lysis of adhesions. Per patient reports, she feels better since the surgery. Note, greater than 60 minutes was spent in face to face communication with patient and caregiver.

## 2012-09-07 NOTE — Patient Instructions (Signed)
  I will request your medical records and review.

## 2012-09-07 NOTE — Assessment & Plan Note (Addendum)
This appears to be resolved and doing well. RLE remains slightly discolored (reddened)  but there is very little inflammation. She is s/p vancomycin and clindamycin. Patient was also seen by ID

## 2012-09-11 ENCOUNTER — Encounter: Payer: Medicare Other | Attending: Internal Medicine | Admitting: *Deleted

## 2012-09-11 ENCOUNTER — Encounter: Payer: Self-pay | Admitting: *Deleted

## 2012-09-11 VITALS — Ht 64.0 in | Wt 292.0 lb

## 2012-09-11 DIAGNOSIS — E669 Obesity, unspecified: Secondary | ICD-10-CM | POA: Insufficient documentation

## 2012-09-11 DIAGNOSIS — Z713 Dietary counseling and surveillance: Secondary | ICD-10-CM | POA: Insufficient documentation

## 2012-09-11 NOTE — Patient Instructions (Addendum)
Goals:  Aim for 90-110 g of protein daily  Try adding the Unjury unflavored version to your water or other drinks.   Keep fluids up to 64-100 fl oz   Will get back to you as soon as I talk with Dr. Daphine Deutscher @ [facility]

## 2012-09-11 NOTE — Progress Notes (Addendum)
  Medical Nutrition Therapy:  Appt start time: 1500  End time: 1630.  Assessment: Obesity (278.00).  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 She is 11 yrs s/p RYGB and recently admitted to [facility] (2/11-2/15) for right lower extremity cellulitis. Reports she was on TPN prior to admit (since 03/2012), which was d/c'd while in hospital when PICC line broke. Pt seen by RD in hospital who recommended d/c of TPN and increase of oral foods. Pt very upset in my office and states "all the doctors think I just want pain meds. Something is wrong with me".  Caregiver reports her weight and albumin have been decreasing steadily (lost 5.8% UBW in last 3 wks; 9 lbs in 11 days).  Abdominal ascites noted at visit. Pt also reports she has "soft bones".  Likely deficient in many nutrients. Fluid and PO intake highly decreased.  Will consult Dr. Wenda Low on her case.   PMH TIMELINE:  2002: RYGB - Start weight 420 lbs; down to 380-390 lbs 07/2011: Stomach pain began; weight 370 lbs 10/2011:  Knee surgery 4/13-7/13: Low PO intake d/t GERD - could not eat; Lost ~80 lbs in 3 months 01/2012: Dr. Smitty Cords [facility]) surgery for abdominal adhesions 03/2012: TPN and vancomycin ordered for 1 week by Dr. Smitty Cords *Chart notes (under media tab) show pt continues on TPN, though MDs unsure who is managing. 08/19/12-08/23/12: [place] for cellulitis - TPN d/c'd; pt observed eating 50% of meals PO   WEIGHT HISTORY: 07/2011 - 370 lbs (pt reported) 03/31/12 - 296 lbs ([facility]) 08/19/12 - 310 lbs ([facility]) 09/02/12 - 303 lbs ([facility]) 09/11/12 - 292 lbs [facility])  *NOTE: Pt has lost 5.8% UBW (18 lbs) in last 3 weeks = SEVERE WEIGHT LOSS  RECENT LABS:  (08/19/12, 08/23/12) HGB: 10.7,  8.1  Albumin: 2.7, 2.2 BUN: 74, >90, though 08/24/12 = 80 *Note: Urine on 08/19/12 shows calcium oxalate crystals  24 Hr Dietary Recall B: 2 Tbsp boiled chicken, 1.5 Tbsp microwaved potatoes S: Few chips (with meds) L: 1/3  hamburger with 1/3 bun, mustard S: Few chips (with meds) D: 1/4 [employer] sandwich (no bun) S: 1/3 mustard sandwich (white bread) Beverages: Drinks ~16 oz protein shake (made with SF ice cream) daily.  MEDICATIONS: See medication list.  SUPPLEMENTS:  None at this time  Usual physical activity: None at this time;  Pt is wheelchair bound with chronic pain.  Estimated energy needs:  TBD   Progress Towards Goal(s):  In progress.   Nutritional Diagnosis:  Harvey-3.2 Unintentional weight loss related to decreased oral intake as evidenced by unintentional weight loss of 18 lbs in 3 weeks.    Intervention:  Nutrition education regarding protein and energy needs. Need new labs drawn. Will consult with Dr. Wenda Low for assistance with further care plan.   Samples given during visit include:  Premier Protein shake: 1 ea Lot: [id number]; Exp: 07/21/13  Unjury Protein powder Lot: [id number]; Exp: 03/15 - 1 pkt Lot: [id number]; Exp: 04/15 - 5 pkts Lot: [id number]; Exp: 06/15 - 3 pkts  [id number] MVI: 3 bottles @ 5 tabs/bottle  Monitoring/Evaluation:  Dietary intake, exercise, and body weight in 4 week(s) or sooner if needed.

## 2013-01-15 ENCOUNTER — Ambulatory Visit: Payer: Self-pay

## 2013-06-18 IMAGING — CT CT ABD-PELV W/ CM
1 of 2 series · 15 of 32 positions shown, 19 images · non-contrast
Comparison: none

REASON FOR EXAM: (1) gastric bypass, abd pain; (2) gastric byopass, abd
pain
COMMENTS:

PROCEDURE:     CT  - CT ABDOMEN / PELVIS  W  - January 18, 2012  [DATE]
RESULT:
TECHNIQUE: Helical 3 mm sections were obtained from the lung bases through
the pubic symphysis status post intravenous administration of 100 ml of
Usovue-VWW and oral contrast.

[Series 2: soft tissue · axial · 0.98mm/px · z∈[-586,-142]mm · 15 of 162 slices shown, 19 images]
[im 7/162  soft-tissue]
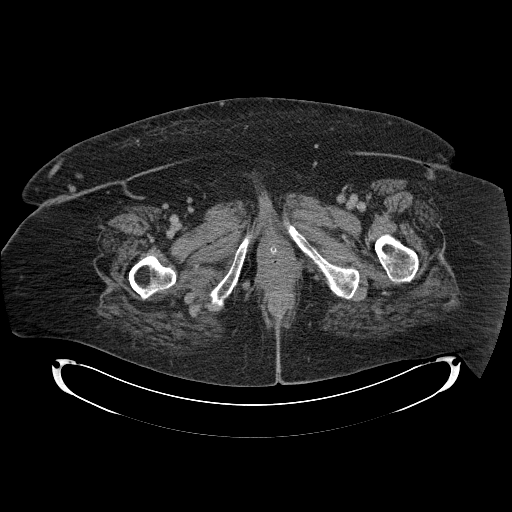
[im 7/162  bone]
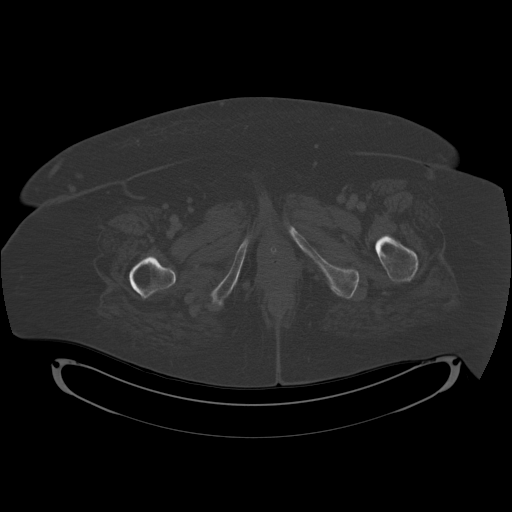
[im 20/162  soft-tissue]
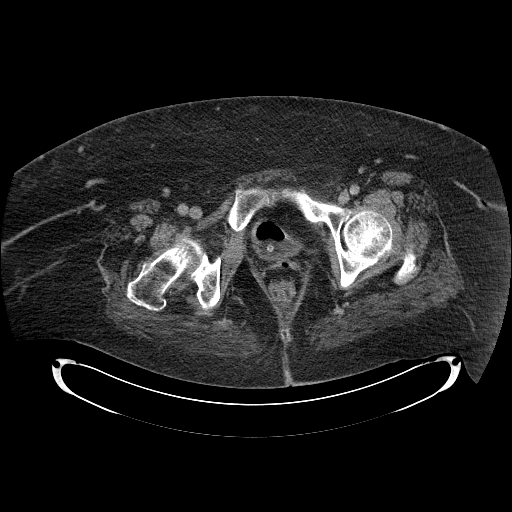
[im 33/162  soft-tissue]
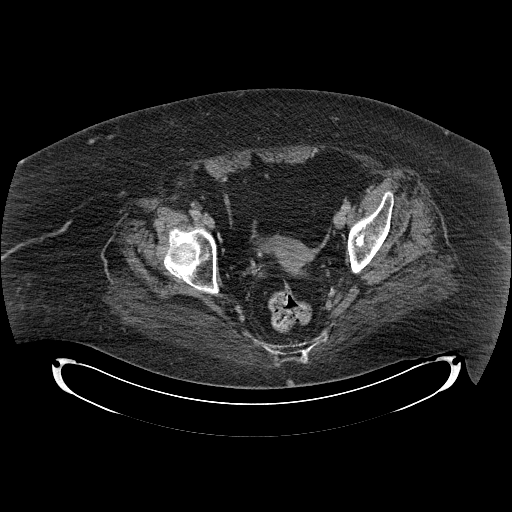
[im 46/162  soft-tissue]
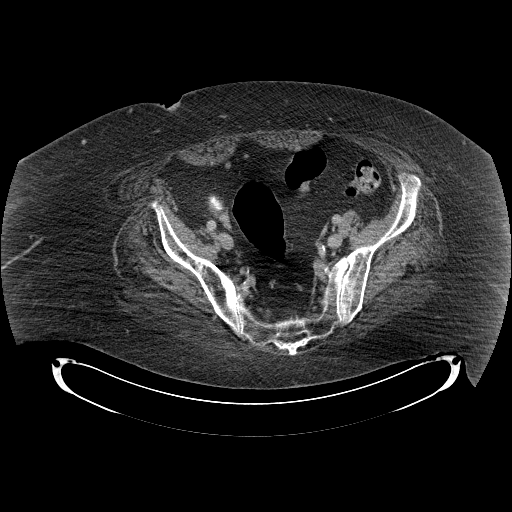
[im 58/162  soft-tissue]
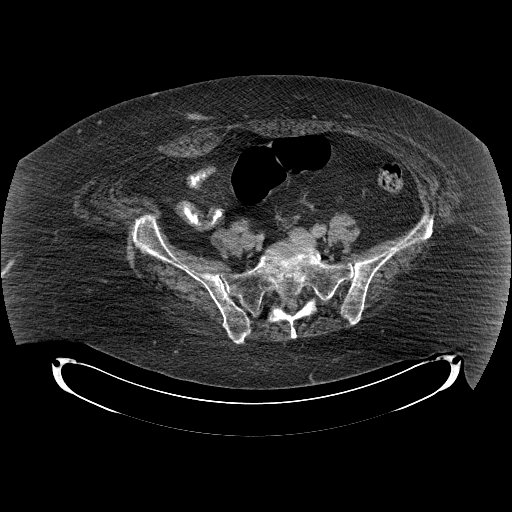
[im 71/162  soft-tissue]
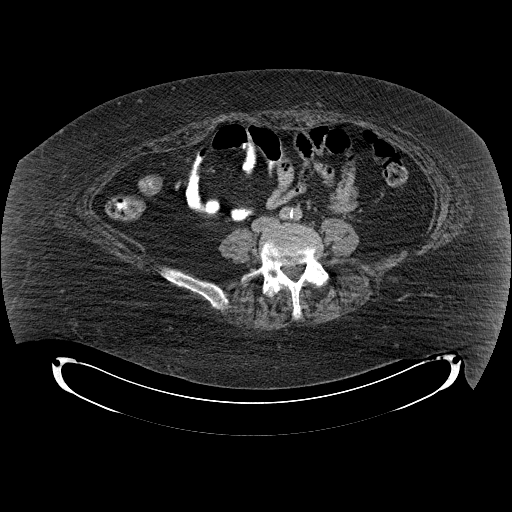
[im 84/162  soft-tissue]
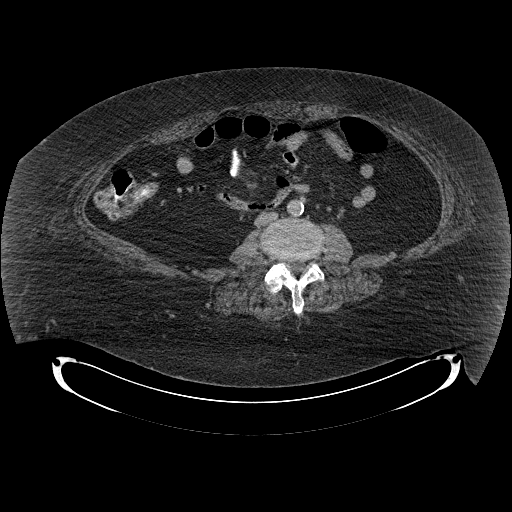
[im 91/162  soft-tissue]
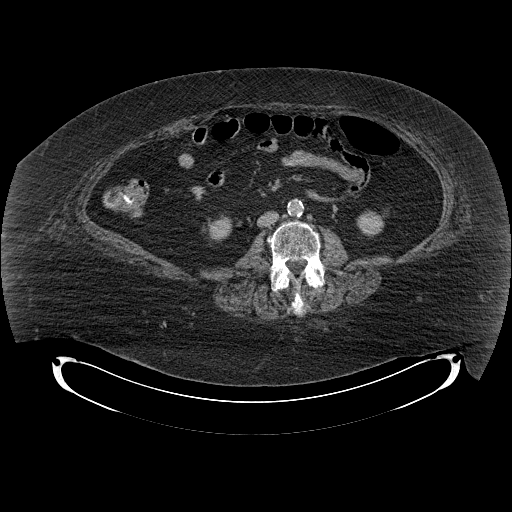
[im 104/162  soft-tissue]
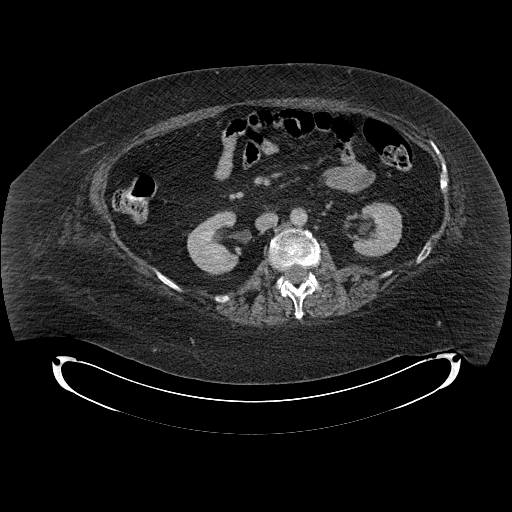
[im 104/162  bone]
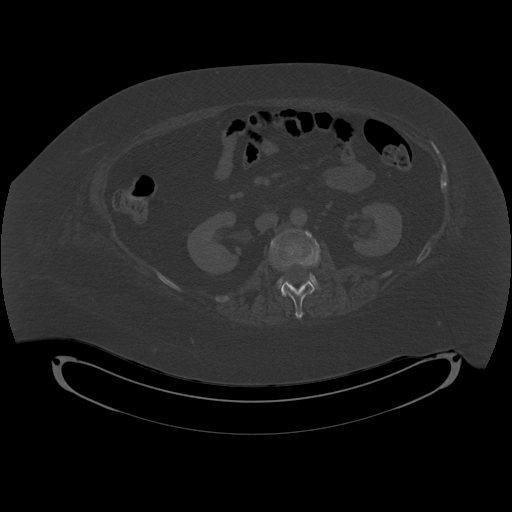
[im 116/162  soft-tissue]
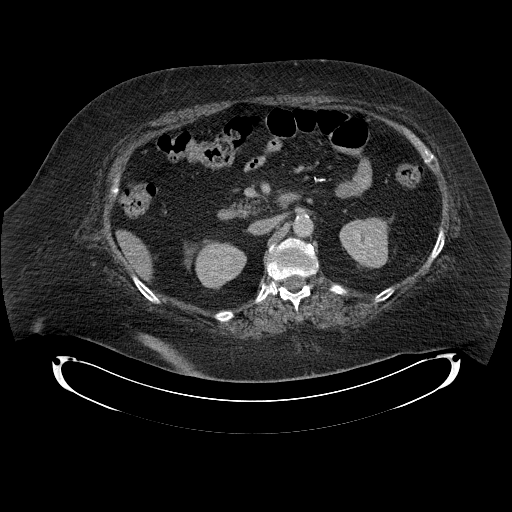
[im 129/162  soft-tissue]
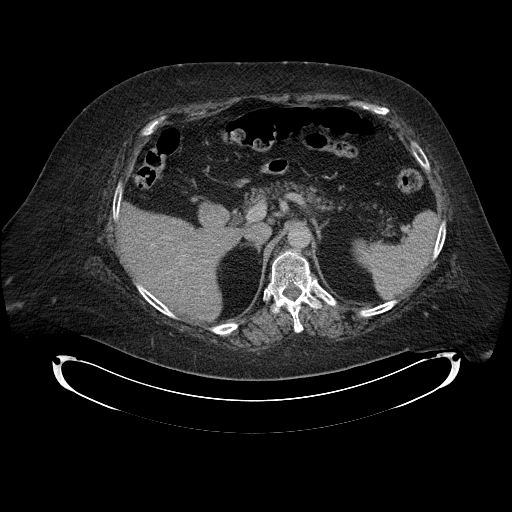
[im 136/162  lung]
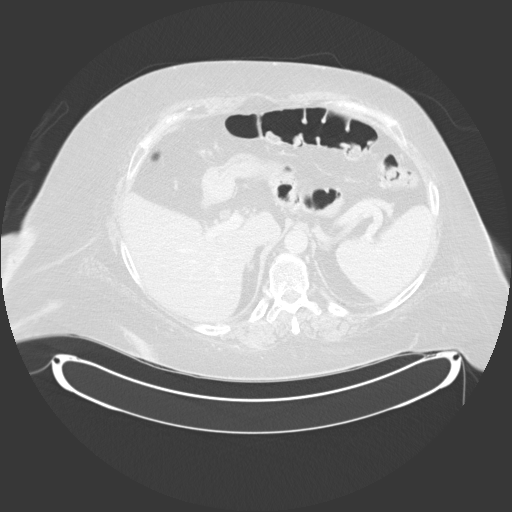
[im 142/162  soft-tissue]
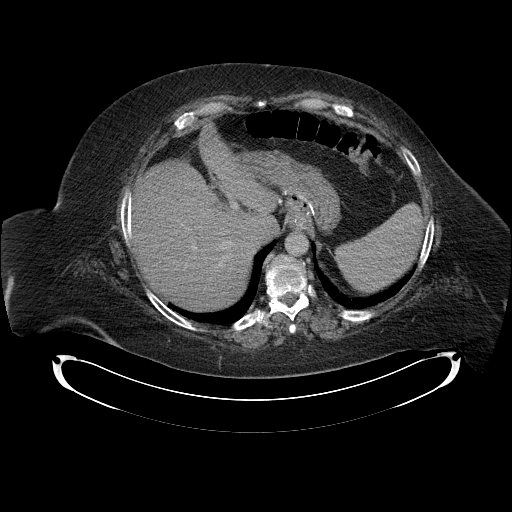
[im 142/162  lung]
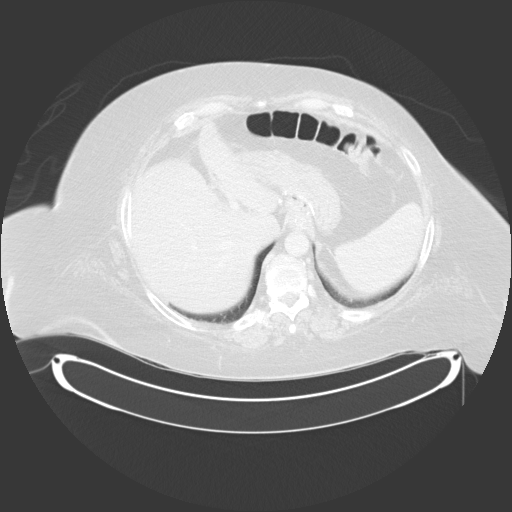
[im 149/162  lung]
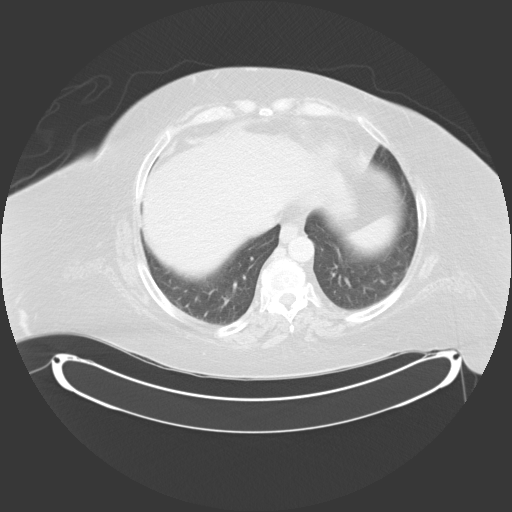
[im 155/162  soft-tissue]
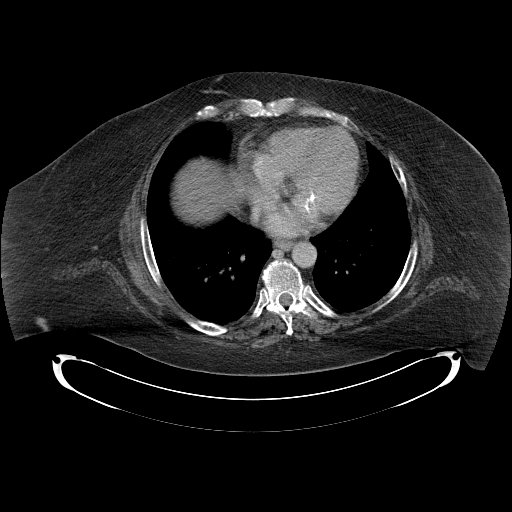
[im 155/162  lung]
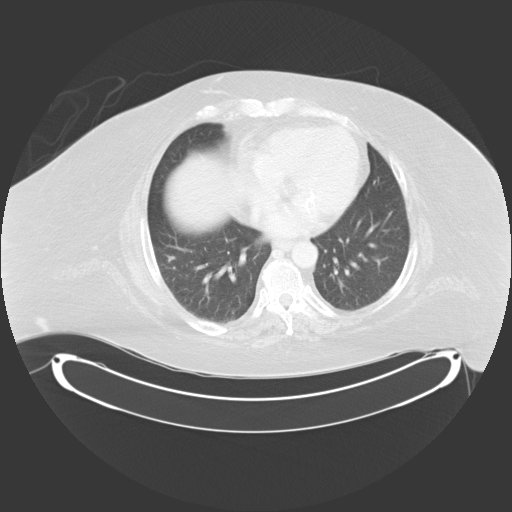

[15 of 32 positions shown; findings below may reference images not displayed]

FINDINGS: The lung bases are unremarkable.

The liver, spleen, adrenals, pancreas, and kidneys are unremarkable. Post
surgical changes consistent with prior gastric bypass are appreciated. There
is no CT evidence of bowel obstruction. There are findings suspicious for
mild bowel wall thickening versus incomplete distention of loops of small
bowel within the right lower quadrant. There is no CT evidence of an
abdominal aortic aneurysm. There is no evidence of abdominal or pelvic free
fluid, loculated fluid collections, masses or adenopathy. There is no
evidence of bowel obstruction.
IMPRESSION: 1.     Findings which represent early or mild inflammatory changes involving
loops of small bowel within the right lower quadrant and clinical
correlation is recommended. There are otherwise no further secondary signs
to suggest enteritis, colitis or diverticulitis. There is no evidence of
bowel obstruction.
2.     A preliminary faxed reported was relayed to the patient's floor on
01/18/2012 at [DATE], EST.

## 2014-01-06 DEATH — deceased

## 2014-01-18 IMAGING — CR DG CHEST 2V
1 series · 1 of 1 positions shown · non-contrast
Comparison: None.

CLINICAL DATA: Fever.

CHEST - 2 VIEW

[view not recorded]
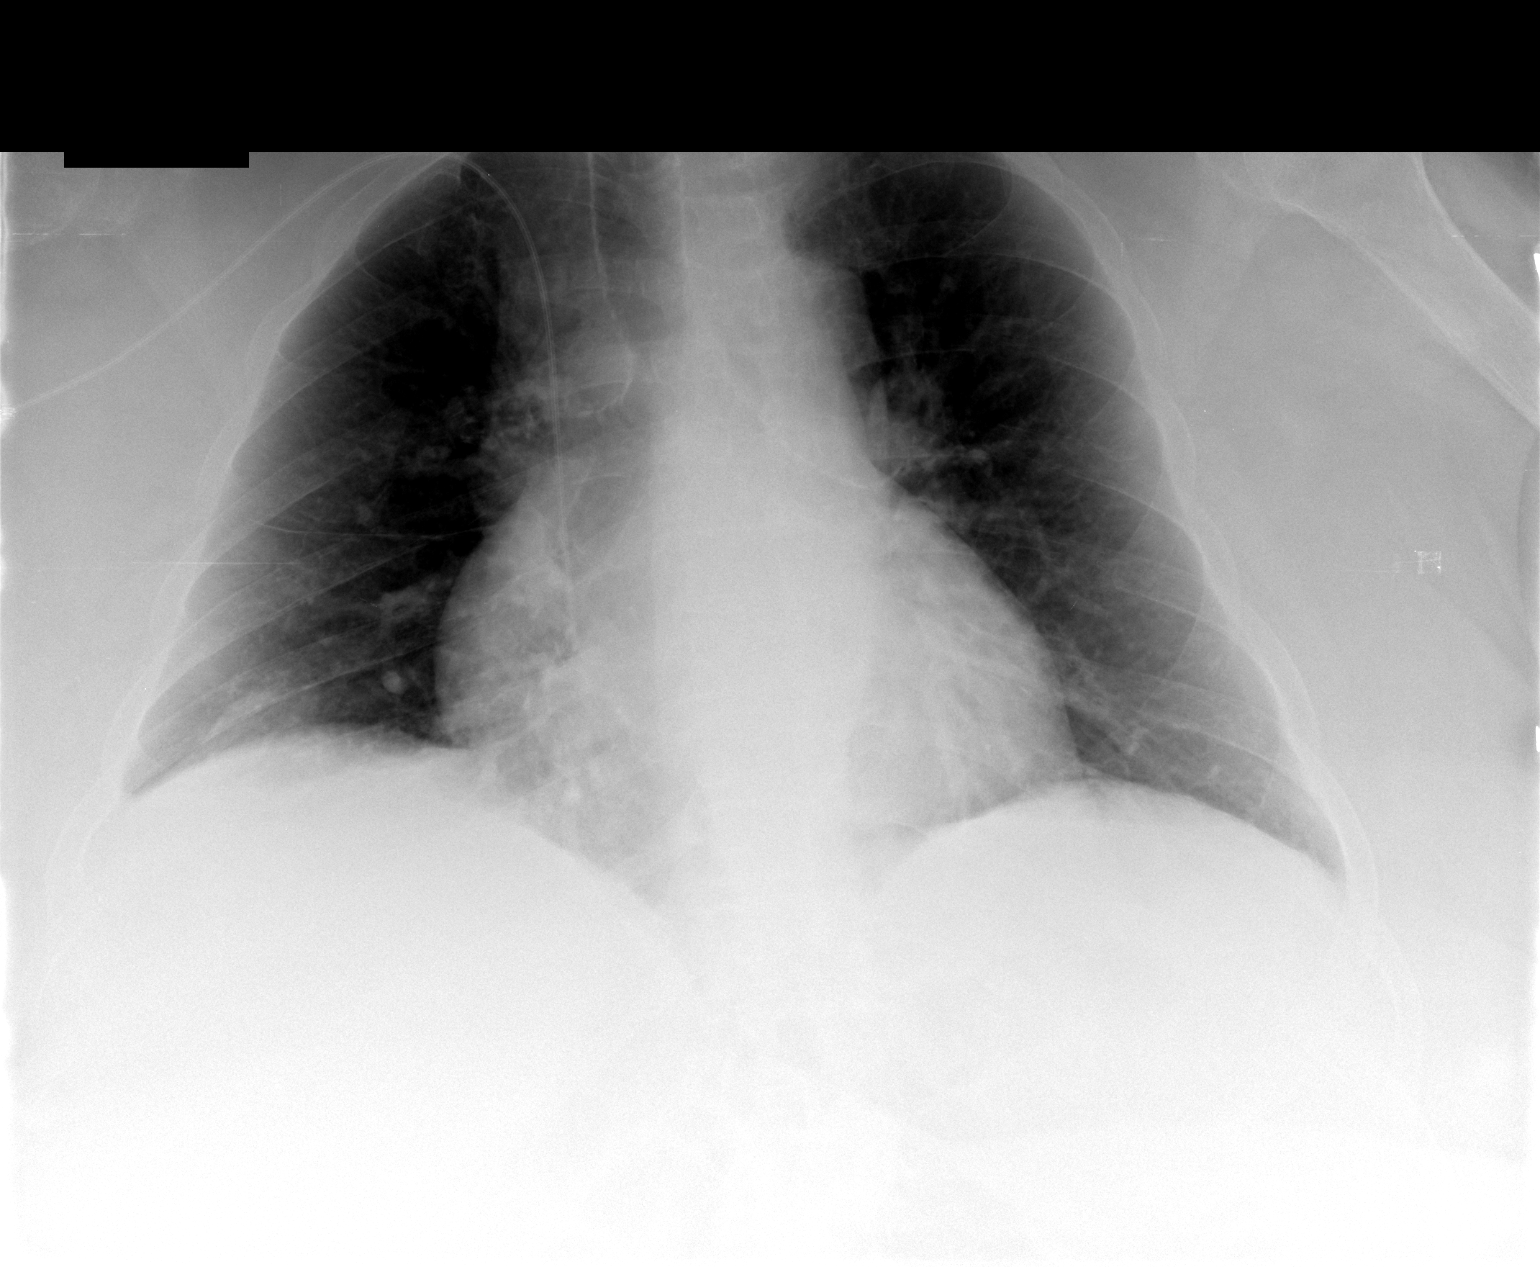

[1 of 1 positions shown; findings below may reference images not displayed]

FINDINGS: The lungs are well-aerated.  Mild opacity at the right
lung base may reflect atelectasis or possibly mild pneumonia.
There is no evidence of focal opacification, pleural effusion or
pneumothorax.

The heart is mildly enlarged; the mediastinal contour is within
normal limits.  No acute osseous abnormalities are seen.  The
patient's right PICC is noted extending into the right atrium.
IMPRESSION: 1.  Mild opacity at the right lung base may reflect atelectasis or
possibly mild pneumonia.
2.  Mild cardiomegaly.

## 2014-10-26 NOTE — Discharge Summary (Signed)
PATIENT NAME:  Jacqueline James, Jacqueline James MR#:  [id number] DATE OF BIRTH:  [birth year]  DATE OF ADMISSION:  01/18/2012 DATE OF DISCHARGE:  01/22/2012  DISCHARGE DIAGNOSES:  1. Intractable nausea and vomiting secondary to partial small bowel obstruction.  2. Obstructive sleep apneas.  3. Hypertension.  4. Osteoarthritis.   DISCHARGE MEDICATIONS:  1. Flovent 110 mcg 2 puffs b.i.James.  2. Flonase two sprays daily.  3. Mirtazapine 15 mg at bedtime.  4. Dulcolax 10 mg q.8 p.r.n.  5. Oxycodone 5 mg q.4 hours p.r.n.  6. Potassium phosphate 1 tablet b.i.James.  7. Micardis 20 mg daily.  8. Lorazepam 0.25 mg q.6 p.r.n.  9. Protonix 40 mg daily.   REASON FOR ADMISSION: This is a 59 year old female who presents with weakness, abnormal weight loss, and intractable nausea and vomiting. Please see history and physical for history of present illness, past medical history, and physical exam.   HOSPITAL COURSE: The patient was admitted. Prior endoscopy had been normal. Abdominal CT showed some minimal stranding of the right lower quadrant. She was empirically started on Cipro. GI saw her and suggested small bowel follow-through but she refused. Her electrolytes were replaced with IV magnesium and p.o. phosphorus. Her blood pressure was controlled with Micardis. Anxiety was a significant issue and mirtazapine and Ativan were given. With her persistent symptoms, Dr. Smitty CordsBruce of Bariatric Surgery saw her and thought more than likely her symptoms were related to complications from her prior gastric bypass surgery and would need reoperation and suggested transfer to [facility] where there is better ancillary support. That transfer will be made and he will be taking over her care.    OVERALL PROGNOSIS: Guarded.   ____________________________ Danella PentonMark F. Ethanjames Fontenot, MD mfm:drc James: 01/22/2012 11:06:36 ET T: 01/23/2012 09:53:09 ET JOB#: [id number]  cc: Danella PentonMark F. Sheril Hammond, MD, <Dictator> Danella PentonMARK F Jeromy Borcherding MD ELECTRONICALLY SIGNED 01/24/2012 8:15

## 2014-10-26 NOTE — Consult Note (Signed)
PATIENT NAME:  Jacqueline James, Jacqueline James MR#:  [id number] DATE OF BIRTH:  1956/01/11  DATE OF CONSULTATION:  01/19/2012  REFERRING PHYSICIAN:  Dr. Hyacinth MeekerMiller CONSULTING PHYSICIAN:  Jacqueline AmasJames S. Kasen Sako, MD  REASON FOR CONSULTATION: Anxiety, depression, rule out psychosocial cause of abdominal pain.   HISTORY OF PRESENT ILLNESS: Ms. Jacqueline James is a 59 year old female admitted to the [facility] on 01/18/2012 with abdominal pain and vomiting. She was also having dysphagia prior to admission. She has undergone a complete organic work-up including CT, and no organic cause has been found. She does not have any hallucinations or delusions. Her orientation and memory function are intact. She has no thought disorganization, and she is socially appropriate.   She does have worry regarding her abdominal symptoms and her upper GI symptoms. She does not show any evidence of repressing excessively. She talks of life stresses in a rational and open manner. She does have some feeling on edge and insomnia. She does have mild-to-moderate depressed mood and decreased energy. Her interests are mildly decreased. Her concentration is mildly decreased. She describes her stresses with her gastrointestinal symptoms being the #1 stress. See the past medical history. She does have limited social support. She lives by herself. The anxiety and depression symptoms have correlated with the worsening of her abdominal symptoms and upper GI symptoms over three weeks.   Also, she had a right total knee replacement in April prior to a worsening of gastroesophageal reflux. She is currently being given Ativan 0.5 mg every 4 hours p.r.n. She continues to have nausea.  PAST PSYCHIATRIC HISTORY: No history of periods of depressed mood, low energy, difficulty concentrating, with thoughts of hopelessness and helplessness. She has no history of elevated mood, decreased need for sleep, increased energy, racing thoughts or increased  goal-directed activity. She has no history of hallucinations or delusions. She has not undergone any psychiatric admission. She has no history of eating disorder symptoms. She has no history of suicide attempt. There is no evidence that she has entered into a pattern of assuming the sick role for a chronic course. Her psychotropic medication history includes the Ativan that she is currently on.  FAMILY PSYCHIATRIC HISTORY: None known.   SOCIAL HISTORY: She does not use alcohol or illegal drugs. She lives alone.   OCCUPATION: Unemployed, disabled due to her medical condition.   PAST MEDICAL HISTORY:  1. Asthma. 2. Hypertension. 3. Obstructive sleep apnea.   PAST SURGICAL HISTORY:  1. Cholecystectomy. 2. Appendectomy.  3. A lap gastric bypass.  4. Please see the other medical history in the history of present illness.   ALLERGIES: Antihistamines, aspirin, Demerol, erythromycin, other nonsteroidal anti-inflammatory drugs, penicillin, Phenergan and Latex .   MEDICATIONS:  1. Zofran 4 mg IV every 4 hours p.r.n.  2. Roxicodone 5 mg every 4 hours p.r.n.  3. Fluticasone 2 sprays per nostril daily.  4. Bentyl 10 mg every 6 hours p.r.n. pain.   LABORATORY, DIAGNOSTIC AND RADIOLOGICAL DATA: Hepatic panel is remarkable for a reduction in albumen at 2.9. Her chemistry panel shows a slight decrease in potassium at 3.3. Here CBC is unremarkable.   REVIEW OF SYSTEMS: Constitutional, HEENT, mouth, neurologic, psychiatric, cardiovascular, respiratory, gastrointestinal, genitourinary, skin, musculoskeletal, hematologic, lymphatic, endocrine, and metabolic are unremarkable.   PHYSICAL EXAMINATION:  VITAL SIGNS: Temperature 98.2, pulse 79, respiratory rate 20, blood pressure 168/97.   GENERAL APPEARANCE: Ms. Jacqueline James is a middle-aged female partially reclined in her hospital bed. Her hygiene is normal. Her muscle tone is normal.  Grooming is normal.  MENTAL STATUS EXAM: Ms. Jacqueline James is alert. Eye contact is  good. Orientation is intact to all spheres. Abstraction is intact. Memory is intact to immediate, recent, and remote. Speech involves normal rate and prosody without dysarthria. Thought process is logical, coherent, and goal directed. No looseness of associations or tangents. Thought content: No thoughts of harming herself, no thoughts of harming others. No delusions or hallucinations. Affect is mildly anxious. Mood is mildly depressed. Insight is intact. Judgment is intact.   ASSESSMENT:  AXIS I:  1. 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 However, it is in the context of emerging general medical problems during the past six months. She has been on recent ciprofloxacin for infection. She has undergone surgery. These organic factors can contribute to faculties such as concentration and can result in reduction of energy.   2. Anxiety disorder, not otherwise specified.   AXIS II: Deferred.   AXIS III: Gastroesophageal reflux disease. See the past medical history.   AXIS IV: General medical, primary support group.   AXIS V: 55.   Ms. Jacqueline James is not at risk to harm herself or others. She agrees to call Emergency Services immediately for any thoughts of harming herself, thoughts of harming others, or distress.   She agrees to not drive if drowsy once her driving capacity returns.   The indications, alternatives and adverse effects of Remeron were discussed with the patient for anti-acute insomnia, antidepression and antinausea. She understands and does not want to proceed with the Remeron yet. She wants her caretaker to do some research on the web regarding Remeron before she starts it. The caretaker was present at the bedside throughout the session as requested by the patient. If the Remeron is started, the patient understands that she cannot use it without CPAP; I would give her a test dose of Remeron SolTab 15 mg at 7:00 p.m. with CPAP in place  if the patient agrees to start Remeron.   The possibilities of various psychological factors contributing to her somatic symptoms were rejected by the patient. This is not an unusual reaction, and she rejects any psychotherapy.  While the rejection any psychologic etiology can be a soft sign for [place], she does not show a pattern of excessive repression (repression of the will to assume the sick role.)   Also, given her ability to discuss her stresses in an open and rational fashion makes conversion disorder and its associated repression unlikely. However, the psychologic possibilities above still remain. The standard final psychologic recommendation in these cases is to continue to recommend psychotherapy to the patient with the rationale as follows: Regardless of what role psychological factors play, she can benefit from supportive counseling at a time like this. Home Health psychotherapy is difficult to find. Given her current capacity, it is likely that she would need Home Health psychotherapy if she changed her mind and agreed to psychotherapy.   Regarding the Remeron,  Remeron has shown evidence of acute anxiolytic effect. There was a small double-blind crossover study with Valium showing Remeron and Valium to be equal. Also, Remeron can block 5-HG2 and 5-HG3 receptors like Zofran. As discussed above, it can provide anti-insomnia benefits and it can provide antidepression.  ____________________________ Jacqueline Amas. Reily Ilic, MD jsw:cbb James: 02/11/2012 18:05:00 ET T: 02/11/2012 18:44:41 ET JOB#: [id number]  cc: Jacqueline Amas. Johniya Durfee, MD, <Dictator>

## 2014-10-26 NOTE — Consult Note (Signed)
Chief Complaint:   Subjective/Chief Complaint Case discussed with Dr. Ricki RodriguezSkulski who kindly agreed to follow the pataint from now onward. Thanks.   Electronic Signatures: Lurline DelIftikhar, Shakena Callari (MD)  (Signed 15-Jul-13 18:43)  Authored: Chief Complaint   Last Updated: 15-Jul-13 18:43 by Lurline DelIftikhar, Rubylee Zamarripa (MD)

## 2014-10-26 NOTE — Consult Note (Signed)
Chief Complaint:   Subjective/Chief Complaint patietn continues to complain of abdominal discomfort, mostly epigastric, emesis/dry heaves.   VITAL SIGNS/ANCILLARY NOTES: **Vital Signs.:   15-Jul-13 13:15   Vital Signs Type Routine   Temperature Temperature (F) 98   Celsius 36.6   Temperature Source Oral   Pulse Pulse 77   Respirations Respirations 18   Systolic BP Systolic BP 161   Diastolic BP (mmHg) Diastolic BP (mmHg) 97   Mean BP 118   Pulse Ox % Pulse Ox % 96   Pulse Ox Activity Level  At rest   Oxygen Delivery Room Air/ 21 %   Brief Assessment:   Cardiac Regular    Respiratory clear BS    Gastrointestinal details normal Soft  Nondistended  No masses palpable  Bowel sounds normal  No rebound tenderness  minimal epigastric discomfort   Lab Results: Routine Chem:  15-Jul-13 07:52    Magnesium, Serum  1.7 (1.8-2.4 THERAPEUTIC RANGE: 4-7 mg/dL TOXIC: > 10 mg/dL  -----------------------)   Phosphorus, Serum  2.2 (Result(s) reported on 21 Jan 2012 at 08:53AM.)   Assessment/Plan:  Assessment/Plan:   Assessment 1) abdominal pain, uncertain etiology.  patient has significant anxiety, and while talking to Ms Threasa Beardsingen, I had to get her to focus back on the discussion multiple times.  While talking with her, describing what testing may need to be done, particularly sbs and colonoscopy (she states she has never had a colonoscopy) she states she "cant do that until she is feeling better", and refuses to allow that testing.  I have stated to her that the testing needs to be done to to figure out the problem, to which she counters argumentatively.    Plan 1) although there could be some amount of functional or pathologic basis for the patients complaints,evaluation to rule that out is difficult in the setting of patient refusal to cooperate with evaluation.  I have talked with Ms Threasa Beardsingen, in this regard, and with talk with her again tomorrow.  Results of the ct are equivocal, possible  artifact versus finding of small bowel abnormality.  Patients labs, with the exception of protein and albumen are relatively unremarkable.  Recommend sbs when clinically feasible, and eventual colonoscopy, though I am unsure that patient will allow testing.  followimg.   Electronic Signatures: Barnetta ChapelSkulskie, Martin (MD)  (Signed 15-Jul-13 20:41)  Authored: Chief Complaint, VITAL SIGNS/ANCILLARY NOTES, Brief Assessment, Lab Results, Assessment/Plan   Last Updated: 15-Jul-13 20:41 by Barnetta ChapelSkulskie, Martin (MD)

## 2014-10-26 NOTE — Consult Note (Signed)
PATIENT NAME:  Jacqueline James, Jacqueline James MR#:  [id number] DATE OF BIRTH:  1955/12/17  DATE OF CONSULTATION:  01/22/2012  REFERRING PHYSICIAN:   CONSULTING PHYSICIAN:  Primus Bravo, MD  REASON FOR CONSULTATION: Abdominal pain status post gastric bypass.  CLINICAL HISTORY: Ms. Jacqueline James is a 59 year old female status post gastric bypass in 2002 and has failure to thrive at [facility]. She had extremely limited follow up with 1 to 2 follow ups only. After that she did not see anybody over the past year. She did not follow up thereafter. Her weight at the time of surgery was roughly 380 pounds. She lost down to the low 220s and then regained to 350 likely because of poor diet and  and lack of follow up. She is a patient of Dr. Bethann Punches at [facility] and has basically been having abdominal pain on and off since 5 to 6 weeks and has become progressively more severe. She complains of retching and difficulty and nausea, vomiting, dry heaves. She was admitted for IV fluids and evaluation. She also states that she has had gastroesophageal reflux disease and burping for some time prior to that all the way back to January. She had a knee replacement in April and apparently her symptoms worsened.  PAST MEDICAL HISTORY: 1. High blood pressure.  2. 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 3. Obstructive sleep apnea.  4. Asthma.  PAST SURGICAL HISTORY:  1. Right knee replacement.  2. Gastric bypass. 3. Appendectomy. 4. Cholecystectomy.  ALLERGIES: Antihistamines, aspirin, Demerol, erythromycin, NSAIDs, penicillin, Phenergan, Lasix.   SOCIAL HISTORY: Nonsmoker, nondrinker. Lives at home. Retired ex-nurse, worked here at [employer] as well as [employer] and [employer].   FAMILY HISTORY: Positive for stroke.   MEDICATIONS:  1. Ativan. 2. Betamethasone. 3. Flonase. 4. Flovent. 5. Milk of  magnesia. 6. Nexium. 7. Oxycodone. 8. OxyContin. 9. Potassium. 10. Senokot. 11. Zofran.   REVIEW OF SYSTEMS: CARDIAC: Negative for chest pain or pressure. PULMONARY: No cough or shortness of breath present. ENDOCRINE: No history of diabetes or thyroid disease. NEUROLOGIC: No numbness, tingling, weakening.   PHYSICAL EXAMINATION: GENERAL: She is a pale female morbidly obese, looks in pretty lousy shape overall and looks mildly uncomfortable.   VITAL SIGNS: She is afebrile. Stable vital signs all day for the past 24 hours.  HEENT: Exam is unremarkable.  NECK: Supple.   CHEST: Clear.   HEART: Heart sounds are regular.   ABDOMEN: Soft, obese. There is mild tenderness present down the lower abdomen. Difficult to differentiate.   EXTREMITIES: Extremities reveal no clubbing, cyanosis, or edema.   NEUROLOGIC: Exam is nonfocal.   LABORATORY, DIAGNOSTIC AND RADIOLOGICAL DATA: Labs are pretty much normal. CT scan shows inflammation in the small bowel in the right lower quadrant without any specific cause. Upper endoscopy was reportedly normal but I am waiting for a call back from Dr. Marva Panda on that.  IMPRESSION: Abdominal pain status post gastric bypass, remote 10 years, worsening with evidence of inflamed bowel.  PLAN: Likely needs transfer to [facility] for follow up from a surgical standpoint. She may need a revisional surgery and/or some significant interventional and/or advanced bariatric support care therefore would recommend transfer her to [facility] at this time and likely will need diagnostic laparoscopy at some point.   ____________________________ Primus Bravo, MD jb:cms D: 01/22/2012 08:36:20 ET T: 01/22/2012 12:05:18 ET JOB#: [id number]  cc: Primus Bravo, MD, <Dictator> Danella Penton, MD Christena Deem, MD [facility]  Marjean DonnaM BRUCE MD ELECTRONICALLY SIGNED 02/12/2012 16:34

## 2014-10-29 NOTE — Op Note (Signed)
PATIENT NAME:  Jacqueline James, Jacqueline James MR#:  [id number] DATE OF BIRTH:  [birth year]  DATE OF PROCEDURE:  01/15/2013  PREOPERATIVE DIAGNOSIS:  Cellulitis, right leg, with poor venous access and need for extended intravenous antibiotics.  POSTOPERATIVE DIAGNOSIS: Cellulitis, right leg, with poor venous access and need for extended intravenous antibiotics.  PROCEDURES:  1. Ultrasound guidance for vascular access to left basilic vein.  2. Fluoroscopic guidance for placement of catheter.  3. Insertion of peripherally inserted central venous catheter, double lumen, left arm.  SURGEON: Festus BarrenJason Damoney Julia, MD   ANESTHESIA: Local.   ESTIMATED BLOOD LOSS: Minimal.   INDICATION FOR PROCEDURE: A 59 year old white female with cellulitis of the right leg.  We are asked to place a PICC line for extended IV antibiotics.   DESCRIPTION OF PROCEDURE: The patient's left arm was sterilely prepped and draped, and a sterile surgical field was created. The left basilic vein was accessed under direct ultrasound guidance without difficulty with a micropuncture needle and permanent image was recorded. 0.018 wire was then placed into the superior vena cava. Peel-away sheath was placed over the wire. A single lumen peripherally inserted central venous catheter was then placed over the wire and the wire and peel-away sheath were removed. The catheter tip was placed into the superior vena cava and was secured at the skin at 46 cm with a sterile dressing.        The catheter withdrew blood well and flushed easily with heparinized saline. The patient tolerated the procedure well. ____________________________ Annice NeedyJason S. Demetrus Pavao, MD jsd:cb James: 01/15/2013 15:13:58 ET T: 01/15/2013 21:51:50 ET JOB#: [id number]  cc: Annice NeedyJason S. Malai Lady, MD, <Dictator> Annice NeedyJASON S Adonus Uselman MD ELECTRONICALLY SIGNED 01/22/2013 9:50

## 2014-10-31 NOTE — Discharge Summary (Signed)
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   DISCHARGE DIAGNOSES:  1. 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  2. Acute blood loss anemia.  3. Hypotension, improved.   ATTENDING: Kennedy Bucker, MD - [facility].   CONSULTING PHYSICIANS: Enedina Finner, MD  PROCEDURES: On 10/18/2011 the patient underwent right total knee arthroplasty by Dr. Kennedy Bucker with assistant Thompson Grayer, PA-C.   ANESTHESIA: General.   ESTIMATED BLOOD LOSS: 200 mL.  SPECIMENS SENT: Cut ends of bone showing severe osteoarthrosis.   OPERATIVE FINDINGS: Severe sclerosis, lateral and patellofemoral, with poor bone quality.   DRAINS: Wound VAC drain was placed.   IMPLANTS: By Scotty Court.   COMPLICATIONS: No complications occurred.   PATIENT HISTORY: The patient is a 59 year old who has had extensive nonoperative treatment for severe bilateral knee arthritis. She has become an essentially household ambulator only secondary to her knee problems. She has significant tricompartmental arthritis with subluxation and bone loss at the patellofemoral joint. She was able to stand for me and take a few steps without assistance with her leg in extended position.   ALLERGIES AND ADVERSE REACTIONS: NSAIDs, penicillin, erythromycin, Demerol, aspirin, antihistamines, and Calan.  PAST MEDICAL HISTORY:  1. Asthma.  2. Sleep apnea. 3. Hypertension.  4. Abdominal hernia. 5. Shingles.  6. Arthritis.  7. Cataracts.  8. Hemorrhoids.  9. Chickenpox.  10. Allergies.   PAST SURGICAL HISTORY:  1. Gastric bypass. 2. Bilateral knee arthroscopy. 3. Left ankle open reduction and internal fixation. 4. Cholecystectomy. 5. Herniorrhaphy.  PHYSICAL EXAMINATION: HEART: Regular rate and rhythm.  LUNGS: Clear to auscultation. RIGHT KNEE: Full extension and motion on examination with flexion to 110 degrees. There was marked crepitation throughout the knee. The patella tracked laterally and could not passively be reduced to the midline. Distally she had trace dorsalis pedis and posterior tibial pulses. The skin was intact. She did not have pain with hip rotation. There was no instability to varus, valgus, posterior drawer, or Lachman test, in the right knee.   X-RAYS: Prior x-rays confirmed severe tricompartmental osteoarthritis, as does her preoperative CT.  HOSPITAL COURSE: The patient underwent the aforementioned procedure on 10/18/2011 without complication and was transferred to the PACU and then the orthopedic floor in stable condition. First day postoperative hemoglobin was 11.8 and this would dip down to 9.9, but no transfusion would be necessary. Her platelets were within normal limits. No abnormal postoperative changes were identified, concerning her right knee x-rays. Medicine was consulted because the patient had some hypotension. This would improve. Medicine thought it might be due to narcotics the patient was on while here. The patient would have her Foley catheter left in an extra day due to inability to ambulate and also for monitoring of output. The patient had ended up in the end tolerating her diet well. She did pass some stool on 10/20/2011. Wound VAC would be removed on her last day here. She had some bloody drainage from her wound VAC, but it was minimal. The patient did work with physical therapy on multiple occasions while here, but really did not ambulate. She did some therapeutic exercise in bed and worked on transfer training and bed mobility.   CONDITION AT DISCHARGE: Stable.   DISPOSITION: [facility].   DISCHARGE MEDICATIONS:  1. Ambien 5 mg oral at bedtime as  needed.  2. Dulcolax 10 mg rectal daily as needed for constipation.  3. Antacid DS suspension 30 mL oral  every six hours as needed for heartburn.  4. Senokot-S 1 tablet oral twice a day.  5. Oxycodone 5 to 10 mg every four hours as needed for pain.  6. Nexium 40 mg oral twice a day.  7. Milk of Magnesia 30 mL oral every two hours. This is the patient's own medicine and she takes it as needed.  8. Colace 100 mg oral four times daily.  9. Flovent HFA 110 mcg inhaler 2 puffs inhalation every 12 hours with spacer.  10. Dulcolax 5 to 10 mg oral every day as needed for constipation. 11. Bariatric Advantage complete multi-formula 1 tablet at lunch time and at 3 o'clock. This is the patient's own medicine.  12. Flonase nasal spray two sprays in both nostrils daily.  13. Tylenol 500 to 1000 mg every six hours as needed for pain or fever.  14. Xarelto 10mg  daily.  Stop in 10 days from 4/15. 15. Telmisartan 40mg  twice a day. 16.  Potassium 10 mEq daily.  DISCHARGE INSTRUCTIONS AND FOLLOW-UP: 1. Please draw serum BUN/creatinine as well as potassium on 10/23/2011. Results should be sent to Dr. Bethann PunchesMark Miller, the patient's primary care physician.  2. Regular diet. The patient wants to avoid sugars. No straws.   3. Physical therapy and occupational therapy consult. The patient is weight-bearing as tolerated on the right knee. She does have degenerative arthritis of the left knee.  4. Thigh-high TED hose are to be worn daily.  5. Polar Care to right knee.  6. Soapsuds enema as needed.  7. Right knee dressing changes in sterile fashion as needed for drainage.  8. Knee immobilizer for the right knee when the patient is up out of bed and until the patient can do a straight leg raise.  9. [facility] for two week postoperative appointment locally. ____________________________ Letta MoynahanJonathan R. Jannel Lynne, [place]PA jrp:slb D: 10/22/2011 12:32:00 ET T: 10/22/2011 13:08:15 ET JOB#: [id number]  cc: Letta MoynahanJonathan R. Clyde CanterburyPrentice, GeorgiaPA, <Dictator> Letta MoynahanJONATHAN R Catharina Pica PA ELECTRONICALLY SIGNED 10/26/2011 11:05

## 2014-10-31 NOTE — Consult Note (Signed)
PATIENT NAME:  Jacqueline James, Jacqueline James MR#:  [id number] DATE OF BIRTH:  1955-10-27  DATE OF CONSULTATION:  10/19/2011  REFERRING PHYSICIAN:  Dr Rudene James CONSULTING PHYSICIAN:  Jacqueline Myers A. Posey Pronto, James PRIMARY CARE PHYSICIAN:  Jacqueline James  REASON FOR CONSULTATION:  Postoperative hypotension.    HISTORY OF PRESENT ILLNESS: Jacqueline James is a 59 year old morbidly obese Caucasian female with multiple medical problems who was admitted on orthopedic service for elective right total knee replacement. The patient underwent surgery April 11, 2013l. The patient's blood pressure prior to surgery was stable. She had about 200 mL of blood loss during surgery and after surgery she was continued on IV fluids. This morning the patient's blood pressure was into the 90s and 100s. During my evaluation the patient's blood pressure was 133/80. Internal Medicine was consulted for postoperative hypotension. The patient was also started on morphine PCA pump. She takes normally Vicodin at home. The patient complains of significant pain on the surgical knee. She has been using her morphine PCA pump.   PAST MEDICAL HISTORY:  1. Hemorrhoids.  2. Cataract surgery.  3. Arthritis.   4. Degenerative joint disease.  5. Gastroesophageal reflux disease.  6. Cardiac arrhythmias.  7. Vertigo.  8. Sleep apnea.  9. Hypertension.  10. Asthma.  11. Appendectomy.  12. Bilateral knee arthroscopic surgery.  13. ORIF left ankle surgery.  14. Gastric bypass surgery in 2003.  15. Cholecystectomy.   MEDICATIONS:  1. Tylenol/Vicodin 5/500 one 4 times a day.    2. Aspercreme topical to knee and affected joints.  3. Ativan 0.5 mg daily.  4. MVI 1 to 2 p.o. daily.  5. Bisacodyl 1 to 2 tablets daily p.r.n.  6. Colace 100 mg 4 times a day.   7. Flonase 50 mcg inhalation 2 puffs nasally once a day.  8. Flovent HFA 110 mcg per inhalation 2 puffs b.i.d.  9.  ophthalmic drops to both eyes as needed.  10. Magnesium oxide 200 mg 1 twice a day.   11. Micardis 40 mg 1/2 tablet 4 times a day.  12. Milk of magnesia 30 mL every 2 hours.  13. Mucinex 600 mg 1/2 tablet daily.   ALLERGIES: Aspirin, Calan, Demerol, erythromycin, NSAIDs, penicillin, Phenergan, and Lasix.   FAMILY HISTORY: Positive for hypertension.   SOCIAL HISTORY: The patient lives at home. Nonsmoker. Denies any alcohol use.   REVIEW OF SYSTEMS:  CONSTITUTIONAL: No fever, fatigue, weakness. EYES: No blurred or double vision. ENT: No tinnitus, ear pain, hearing loss. RESPIRATORY: No respiratory distress, cough or hemoptysis. CARDIOVASCULAR: No chest pain. Positive for hypertension. No dyspnea or leg edema. GI: No nausea or vomiting. Positive for gastroesophageal reflux disease. GENITOURINARY: No dysuria or hematuria. ENDOCRINE: No polyuria or nocturia. HEMATOLOGY: No anemia or easy bruising. SKIN: No acne or rash.  MUSCULOSKELETAL: Positive for arthritis. NEUROLOGIC: No cerebrovascular accident or transient ischemic attack. PSYCHIATRIC: No anxiety or depression. All other systems are reviewed and negative.    PHYSICAL EXAMINATION:  GENERAL: The patient is awake, alert, and oriented x3, not in acute distress, except moderate pain on the right knee. She is afebrile, pulse is 94, blood pressure is 133/80, saturations are 96% on room air.   HEENT: Atraumatic, normocephalic. Pupils are equal, round, and reactive to light and accommodation. Extraocular movements intact. Oral mucosa is moist.   NECK: Supple. No JVD. No carotid bruit.   LUNGS: Clear to auscultation bilaterally. No rales, rhonchi, respiratory distress, or labored breathing.   HEART: Both heart sounds are normal.  Rate, rhythm is regular. PMI not lateralized. Chest is nontender.   EXTREMITIES: Good pedal pulses, good femoral pulses. 1+ pitting edema at present bilaterally. The patient does have a right knee brace cast along with drain and surgical dressing present.   NEUROLOGIC:  Grossly intact cranial nerves II  through XII. No major motor or sensory deficits.   PSYCHIATRIC: The patient is awake, alert, and oriented x3.   LABORATORY DATA: Hemoglobin is 11.8, glucose is 109, BUN is 22, creatinine is 0.78, sodium 136, potassium 4.5, chloride 108. Hemoglobin and hematocrit, CBC are within normal limits. PT/INR within normal limits. ESR is 9.     ASSESSMENT: A 59 year old Jacqueline James with:  1. Postoperative hypotension, appears to be due to narcotics. The patient is currently on PCA pump. The patient's blood pressure has been, systolic has been 16X to 096. Blood pressure much improved now. IV fluid rate was increased to 150. Last blood pressure was 133/80. We will hold off blood pressure medications for today and resume once blood pressure stays more stable most likely from tomorrow morning.  2. status, post right total knee replacement.  3. PCA pump is being managed by Orthopedics.   4. Morbid obesity.  5. Gastroesophageal reflux disease.  6. Asthma appears stable.  Saturations are 96% to 97% on 2 liters.  Wean as tolerated.   PLAN:  1. Continue IV fluids at 150 mL for tonight, reduce it once blood pressure stays more stable. Hold off on blood pressure medications for today and resume it once blood pressure is more stabilized.   2. Wean morphine PCA as tolerated.  3. Deep vein thrombosis with Xarelto per Orthopedics.   4. Agreeing rest of patient's home medications.  5. The patient has been signed off to Jacqueline James and will be followed by Jacqueline James physician on call over the weekend.     Thank you for the consult.   TIME SPENT: 50 minutes     ____________________________ Jacqueline Height A. Posey Pronto, James sap:vtd D: 10/19/2011 14:39:39 ET T: 10/20/2011 09:31:04 ET JOB#: [id number]  cc: Jacqueline Faulkenberry A. Posey Pronto, James, <Dictator> Jacqueline James Jacqueline James Jacqueline James ELECTRONICALLY SIGNED 10/20/2011 18:15

## 2014-10-31 NOTE — Op Note (Signed)
PATIENT NAME:  Jacqueline James, Jacqueline James MR#:  [id number] DATE OF BIRTH:  [birth year]  DATE OF PROCEDURE:  10/18/2011  PREOPERATIVE DIAGNOSIS: Severe right knee osteoarthritis.   POSTOPERATIVE DIAGNOSIS: Severe right  knee osteoarthritis.   PROCEDURE: Right total knee replacement.   ANESTHESIA: General.   SURGEON: Leitha Schuller, MD   ASSISTANT: Thompson Grayer, PA-C   DESCRIPTION OF PROCEDURE: The patient was brought to the Operating Room, and after adequate general anesthesia was obtained the right leg was prepped and draped in the usual sterile fashion with a tourniquet applied to the upper thigh with a bump to internally rotate the leg. After prepping and draping with a [place]   legholder being utilized, appropriate patient identification and timeout procedures were completed. The leg was exsanguinated with an Esmarch and the tourniquet raised to 300 mmHg. A midline skin incision was made, followed by a medial parapatellar arthrotomy. Inspection of the knee revealed an essentially laterally dislocated patella with erosion of the femoral trochlea such that it was flat eburnated bone on the trochlea and the entire surface of the patella with extensive bone loss of the patella and trochlea. In flexion, the lateral compartment had sclerotic bone with complete loss of cartilage and significant wear. The medial compartment had moderate arthritis but without the bone erosion and loss of bone. The anterior horns of the menisci were excised along with the fat pad and the anterior cruciate ligament. The anterior tibia was exposed for the cutting block from the Medacta System. After applying this and checking alignment, the proximal tibia cut was carried out and it matched the model for resections comparing medial to lateral sides anterior to posterior. Next, the femoral guide was applied to the distal femur, drilling made in the guide, and pins placed to hold the guide in position. The distal femoral cut was made,  and a size 4 block was applied. Anterior, posterior, and chamfer cuts were carried out without any notching. At this point, the residual posterior horns of the menisci were excised. A size 4 tibial tray was inserted with the center hole drilled for the pegged portion of the implant, all by Z-cuts with rotation determined by one of the pre-existing pins from the initial cut. The femoral trial was inserted and a 10 mm spacer gauge with quite a bit of medial laxity as there did appear to be significant stretching of the medial capsule with the patient's valgus deformity. Slight release was carried out of the superficial and deep lateral collateral off the tibia to get better soft tissue balance, and it really took a 17 mm insert to give good stability in flexion, mid flexion and extension. At this point, the femoral drill holes were made along with the notch cut in for the trochlea. These components were left in place as the patella was prepared with essentially freehand technique because of such extensive erosions. Just a minimal cut was carried out to the patella and this sized to 2. Three drill holes were made, and with the patella tested there was still subluxation of the patella related to long-standing contracture, and a lateral release was carried out as well. This allowed for normal patellofemoral tracking. At this point, all trial components were removed. The bony surfaces were thoroughly irrigated and dried, and 10 mg of morphine and 30 mL of 0.25% Sensorcaine with epinephrine were infiltrated around the posterior capsule and arthrotomy to aid in postoperative analgesia. The cement was mixed, Simplex P cement, with the tibial component cemented in  place first, followed by implantation of the 17 mm tibial polyethylene insert. The femoral component was cemented into place and the knee held in extension. The patellar button was clamped into place as well. At this point, nothing was done until the cement had set.  At that point, the excess cement was removed with the use of a small osteotome and the notch around the periphery, but the patella tracked well. There was good stability. Full extension was obtained and approximately 110 to 120 degrees flexion. The knee was again thoroughly irrigated and the tourniquet let down. The arthrotomy was repaired using a heavy quill suture, 2-0 quill subcutaneously, followed by skin staples, followed by a wound VAC and Polar Care device, along with an immobilizer. The patient was sent to the recovery room in stable condition.   ESTIMATED BLOOD LOSS: 200 mL.   TOURNIQUET TIME: 70 minutes.   IMPLANTS: Medacta right standard femoral component, 17 mm tibial insert OUC, a #2 patella, and a #4 tibial universal standard plate.   COMPLICATIONS: There were no complications.   SPECIMENS: Cut ends of bone.   ____________________________ Leitha SchullerMichael J. Olie Scaffidi, MD mjm:cbb D: 10/18/2011 21:55:55 ET T: 10/19/2011 13:03:29 ET JOB#: [id number] cc: Leitha SchullerMichael J. Adana Marik, MD, <Dictator> Leitha SchullerMICHAEL J Jennifer Payes MD ELECTRONICALLY SIGNED 10/19/2011 17:00

## 2014-10-31 NOTE — H&P (Signed)
PATIENT NAME:  Jacqueline James, Jacqueline James MR#:  [id number] DATE OF BIRTH:  04-29-1956  DATE OF ADMISSION:  01/18/2012  PRIMARY CARE PHYSICIAN: Dr. Bethann PunchesMark Miller   CHIEF COMPLAINT: Abdominal pain, nausea, vomiting, and poor p.o. intake.   HISTORY OF PRESENT ILLNESS: This is a 59 year old female who presents to the Emergency Room complaining of abdominal pain, nausea, and vomiting ongoing for the past few weeks. The patient has been suffering from some dysphagia, dyspepsia, and abdominal pain now for quite a while. She has been evaluated by gastroenterology by Dr. Marva PandaSkulskie and had a recent endoscopy done about two weeks ago, which showed no acute abnormalities. She was supposed to be getting a CT scan of the abdomen today as an outpatient but prior to getting the CT scan she felt fairly weak and lightheaded and therefore was brought to the ER. She is currently hemodynamically stable, but still complains of significant abdominal pain, nausea, vomiting, and has not been able to eat anything for the past few weeks. She has felt increasingly weak. Due to her symptoms not improving, the hospitalist service was contacted for further treatment and evaluation.   REVIEW OF SYSTEMS:  CONSTITUTIONAL: No documented fever. Positive weight loss of about 80 pounds in the past three months. No weight gain. EYES: No blurred or double vision.  ENT: No tinnitus. No postnasal drip. No redness of the oropharynx. RESPIRATORY: No cough, no wheeze, no hemoptysis. CARDIOVASCULAR: No chest pain, no orthopnea, no palpitations, no syncope. GI: Positive nausea. Positive vomiting. No diarrhea, no melena, no hematochezia.  GU: No dysuria, no hematuria. ENDOCRINE: No polyuria or nocturia. No heat or cold intolerance. HEME: No anemia, no bruising, no bleeding. INTEGUMENT: No rashes. No lesions. MUSCULOSKELETAL: No arthritis, no swelling, and no gout. NEUROLOGIC: No numbness, no tingling, no ataxia, no seizure type activity. PSYCH:  Positive anxiety. No  insomnia, no ADD.   PAST MEDICAL HISTORY:  1. Morbid obesity. 2. History of asthma.  3. Hypertension.  4. Obstructive sleep apnea.   PAST SURGICAL HISTORY:  1. Recent right knee replacement. 2. Lap gastric bypass.  3. Appendectomy.  4. Cholecystectomy.   ALLERGIES: Multiple medications including antihistamines, aspirin, Demerol, erythromycin, NSAIDs, penicillin, Phenergan, latex.   SOCIAL HISTORY: No smoking. No alcohol abuse. No illicit drug abuse. Lives at home by herself.   FAMILY HISTORY: Father and mother both had history of strokes. Father also had a myocardial infarction and died from leukemia.   CURRENT MEDICATIONS:  1. Ativan 0.5 mg every four hours as needed for anxiety. 2. Betamethasone and clotrimazole cream to be applied twice a day as needed to the groin. Ciprofloxacin 500 mg b.i.James. times five days.  3. Flonase two sprays to each nostril daily.  4. Flovent 2 puffs b.i.James.  5. Milk of Magnesia 30 mL as needed for constipation.  6. Nexium 40 mg b.i.James.  7. Oxycodone 5 mg q. four hours as needed.  8. OxyContin 10 mg at bedtime.  9. Potassium 20 mEq daily.  10. Senokot 1 tablet q.i.James. and at bedtime.  11. Zofran 4 mg q. four hours as needed.   PHYSICAL EXAMINATION ON ADMISSION:  VITAL SIGNS: Temperature 98.2, pulse 82, respirations 16, blood pressure 132/68, sats 98% on room air.   GENERAL: The patient is a very anxious-appearing female, teary-eyed but in no apparent distress.   HEENT: Atraumatic, normocephalic. Extraocular muscles are intact. Pupils are equal and reactive to light. Sclerae anicteric. No conjunctival injection. No pharyngeal erythema.   NECK: Supple. No jugular venous  distention. No bruits, no lymphadenopathy, no thyromegaly.   HEART: Regular rate and rhythm. No murmurs, rubs, or clicks.   LUNGS: Clear to auscultation bilaterally. No rales, no rhonchi, no wheezes.   ABDOMEN: Soft, flat, tender diffusely but no rebound or rigidity. Good bowel  sounds. No hepatosplenomegaly appreciated.   EXTREMITIES: No evidence of any cyanosis, clubbing, or peripheral edema. Has +2 pedal and radial pulses bilaterally.   NEUROLOGICAL: The patient is alert, awake, and oriented times three with no focal motor or sensory deficits appreciated bilaterally.   SKIN: Moist and warm with no rash appreciated.   LYMPHATIC: There is no cervical or axillary lymphadenopathy.   LABORATORY, DIAGNOSTIC, AND RADIOLOGICAL DATA: Serum glucose 86, BUN 7, creatinine 0.7, sodium 139, potassium 3.3, chloride 99, bicarbonate 25. The patient's LFTs are within normal limits. White cell count is 4.9, hemoglobin 13.2, hematocrit 39.2, platelet count 206. Troponin less than 0.02.   The patient's urinalysis is within normal limits.   ASSESSMENT AND PLAN: This is a 59 year old female with morbid obesity status post lap gastric bypass, gastroesophageal reflux disease, chronic pain, and anxiety who presents to the hospital due to abdominal pain, nausea, vomiting, and poor p.o. intake. 1. Abdominal pain, nausea, vomiting: The exact etiology is currently unclear. The patient does have a history of lap gastric bypass. She was in the ER a few days ago and diagnosed with a urinary tract infection and started on ciprofloxacin, but I think it unlikely that this is the cause of the patient's symptoms. She is also seen by gastroenterology by Dr. Marva Panda and had a recent endoscopy done which showed no acute abnormalities. For now we will continue supportive care with IV fluids, pain control, and antiemetics. Keep her n.p.o. for now. Get a GI consult with a CT of the abdomen and pelvis with contrast. I urged her to drink the contrast and get the CT, as a noncontrast CT would not give [place] enough information.  2. Anxiety/depression: She is very teary-eyed and anxious. I wonder if there are some psychosocial issues that could be causing her pain. I will continue Ativan for anxiety for now but I will go  ahead and get a psychiatric consult.  3. Gastroesophageal reflux disease: I will continue her Protonix b.i.James.  4. Chronic pain: She does have maybe some mild drug-seeking behavior. We will put her on some oxycodone as needed. Hold IV narcotics for now.  5. Recent urinary tract infection: I will continue her Cipro and let her finish the course for now.   CODE STATUS: The patient is a FULL CODE.  TIME SPENT ON ADMISSION:  60 minutes.  ____________________________ Rolly Pancake. Cherlynn Kaiser, MD vjs:bjt James: 01/18/2012 14:21:30 ET T: 01/18/2012 14:42:20 ET JOB#: [id number]  cc: Rolly Pancake. Cherlynn Kaiser, MD, <Dictator> Danella Penton, MD Houston Siren MD ELECTRONICALLY SIGNED 01/18/2012 15:47

## 2014-10-31 NOTE — Consult Note (Signed)
PCP Dr Loraine LericheMark millerMD Dr Rosita Keamenz Post -op hypotension appears to be due to narcotics.pt currently on PCA pumpmuch improved now. last bp 133/80home bp meds #1 right knee DJD s/p arthroplasty obesity  asthma appears stable. wean as tolerateddm-2    Electronic Signatures: Willow OraPatel, Moxie Kalil A (MD) (Signed on 12-Apr-13 14:09)  Authored   Last Updated: 12-Apr-13 14:10 by Willow OraPatel, Leylah Tarnow A (MD)

## 2014-10-31 NOTE — Consult Note (Signed)
Brief Consult Note: Diagnosis: Abdominal pain.   Patient was seen by consultant.   Comments: Patient with mid to lower abdominal pain which is chronic. CT with ? thickening of small bowel loops in the same area as the area of discomfort. Positive nausea and heaves. May be in part due to Cipro.  Recommendations: Clear liquid diet. Bentyl PRN for symptomatic improvement. DC Cipro as soon as acceptable. IBD panel and Sed rate. Patient will likely need a colonoscopy. Will follow.  Electronic Signatures: Lurline DelIftikhar, Charmane Protzman (MD)  (Signed 13-Jul-13 14:00)  Authored: Brief Consult Note   Last Updated: 13-Jul-13 14:00 by Lurline DelIftikhar, Theo Krumholz (MD)

## 2014-10-31 NOTE — Consult Note (Signed)
Chief Complaint:   Subjective/Chief Complaint Very anxious with lots of complaints and concerns. Complaining of not being able to tlretae liquids due to dry heaves but wants the diet to be advanced.   VITAL SIGNS/ANCILLARY NOTES: **Vital Signs.:   14-Jul-13 00:07   Nurse Fingerstick (mg/dL) FSBS (fasting range 65-99 mg/dL) 131   Comments/Interventions  Nurse Notified    04:51   Vital Signs Type Routine   Temperature Temperature (F) 98.3   Celsius 36.8   Temperature Source oral   Pulse Pulse 87   Respirations Respirations 18   Systolic BP Systolic BP 400   Diastolic BP (mmHg) Diastolic BP (mmHg) 92   Mean BP 115   Pulse Ox % Pulse Ox % 94   Pulse Ox Activity Level  At rest   Oxygen Delivery Room Air/ 21 %    05:32   Nurse Fingerstick (mg/dL) FSBS (fasting range 65-99 mg/dL) 133   Comments/Interventions  Nurse Notified   Brief Assessment:   Additional Physical Exam Abdomen is soft and benign. Non tender.   Lab Results: Hepatic:  14-Jul-13 08:36    Bilirubin, Total 0.7   Alkaline Phosphatase 75   SGPT (ALT) 25 (12-78 NOTE: NEW REFERENCE RANGE 06/01/2011)   SGOT (AST) 27   Total Protein, Serum  5.6   Albumin, Serum  2.5  Routine Chem:  14-Jul-13 08:36    Glucose, Serum  120   BUN  4   Creatinine (comp) 0.70   Sodium, Serum 143   Potassium, Serum  3.4   Chloride, Serum 106   CO2, Serum 29   Calcium (Total), Serum  8.3   Osmolality (calc) 283   eGFR (African American) >60   eGFR (Non-African American) >60 (eGFR values <70m/min/1.73 m2 may be an indication of chronic kidney disease (CKD). Calculated eGFR is useful in patients with stable renal function. The eGFR calculation will not be reliable in acutely ill patients when serum creatinine is changing rapidly. It is not useful in  patients on dialysis. The eGFR calculation may not be applicable to patients at the low and high extremes of body sizes, pregnant women, and vegetarians.)   Anion Gap 8  Routine Hem:   14-Jul-13 08:36    Erythrocyte Sed Rate 13 (Result(s) reported on 20 Jan 2012 at 09:24AM.)   WBC (CBC)  3.5   RBC (CBC) 4.11   Hemoglobin (CBC) 12.3   Hematocrit (CBC) 36.4   Platelet Count (CBC) 192   MCV 88   MCH 30.0   MCHC 33.9   RDW  15.5   Neutrophil % 56.5   Lymphocyte % 28.3   Monocyte % 13.5   Eosinophil % 1.1   Basophil % 0.6   Neutrophil # 2.0   Lymphocyte # 1.0   Monocyte # 0.5   Eosinophil # 0.0   Basophil # 0.0 (Result(s) reported on 20 Jan 2012 at 09:02AM.)   Assessment/Plan:  Assessment/Plan:   Assessment Chronic abdominal pain, ? functional.    Plan Will wait for IBD panel result. Full liquid diet. Will discuss with Dr. SDonnella Shamin am.   Electronic Signatures: IJill Side(MD)  (Signed 14-Jul-13 11:55)  Authored: Chief Complaint, VITAL SIGNS/ANCILLARY NOTES, Brief Assessment, Lab Results, Assessment/Plan   Last Updated: 14-Jul-13 11:55 by IJill Side(MD)

## 2014-10-31 NOTE — Consult Note (Signed)
PATIENT NAME:  Jacqueline James, Jacqueline James MR#:  [id number] DATE OF BIRTH:  12-15-1955  DATE OF CONSULTATION:  01/20/2012  REFERRING PHYSICIAN:   CONSULTING PHYSICIAN:  Lurline DelShaukat Angelie Kram, MD   PRIMARY CARE PHYSICIAN: Bethann PunchesMark Miller, MD   PRIMARY GASTROENTEROLOGIST: Dr. Marva PandaSkulskie   CHIEF COMPLAINT: Abdominal pain, nausea, vomiting.   HISTORY OF PRESENT ILLNESS: This is a 59 year old female with very chronic abdominal pain, recurrent nausea and vomiting according to her. The patient has been evaluated by Dr. 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 No other significant abnormalities were noted. The patient was admitted two days ago again complaining of diffuse abdominal pain as well as some nausea and vomiting. According to her she has not been able to eat anything for the last several months. She is afraid that she will get weak. She has multiple other symptoms such as dry mouth, etc. and appears very anxious and apprehensive.   PAST MEDICAL HISTORY:  1. Morbid obesity. 2. History of asthma. 3. Hypertension. 4. Obstructive sleep apnea.   PAST SURGICAL HISTORY:  1. Recent right knee replacement. 2. Gastric bypass surgery.  3. Appendectomy.  4. Cholecystectomy.   ALLERGIES: Antihistamine, aspirin, Demerol, erythromycin, NSAIDs, penicillin, Phenergan, latex.   SOCIAL HISTORY: Does not smoke or drink.   FAMILY HISTORY: Positive for CVA.   MEDICATIONS AT HOME:  1. Ativan. 2. Betamethasone. 3. Flonase. 4. Flovent. 5. Milk of Magnesia. 6. Nexium. 7. Oxycodone. 8. OxyContin. 9. Potassium. 10. Senokot. 11. Zofran.   PHYSICAL EXAMINATION:   GENERAL: Morbidly obese female, does not appear to be in any acute distress.   VITAL SIGNS: She is afebrile. Vitals are stable.   HEENT: Unremarkable.   NECK: Neck veins are flat.   LUNGS: Grossly clear to auscultation bilaterally with fair air entry and no  added sounds.   CARDIOVASCULAR: Regular rate and rhythm.   ABDOMEN: Obese female. Abdomen is soft and quite benign. No rebound or guarding was noted. Nontender. No hepatosplenomegaly or ascites were noted.   NEUROLOGIC: Appears to be quite nonfocal.   LABORATORY, DIAGNOSTIC, AND RADIOLOGICAL DATA: White cell count 3.5, hemoglobin 12.3, hematocrit 36.4. Sed rate is normal at 13. Electrolytes are fairly unremarkable as well as liver enzymes.   CT scan of abdomen and pelvis is quite unremarkable. There is questionable mild inflammatory changes involving some loops of the small bowel although this may be artifact.   IBD panel was requested and is pending.   ASSESSMENT AND PLAN: The patient is with chronic abdominal pain. She is very anxious and apprehensive. Abdominal examination is quite benign. Recent upper GI endoscopy was quite unremarkable. This appears to be a functional problem and she probably suffers from irritable bowel syndrome. CT scan is suggestive of questionable mild inflammatory changes in the small intestine, although it may be just an artifact due to poor bowel distention. Sed rate was obtained and is normal which speaks against significant inflammatory process. IBD panel is still pending.   I would treat her symptomatically with some p.r.n. Bentyl. I agree with Psych consult which was suggested by Dr. Marva PandaSkulskie. The patient may require a colonoscopy with intubation of the small intestine if possible and will discuss this with Dr. Marva PandaSkulskie as well. The patient has been reassured. Will follow.   ____________________________ Lurline DelShaukat Shukri Nistler, MD si:drc James: 01/20/2012 12:03:17 ET T: 01/20/2012 12:21:56 ET JOB#: [id number]  cc: Lurline DelShaukat Hayslee Casebolt, MD, <Dictator> Lurline DelSHAUKAT Ingram Onnen MD ELECTRONICALLY SIGNED 02/08/2012  13:22
